# Patient Record
Sex: Female | Born: 1960 | Race: White | Hispanic: No | Marital: Married | Smoking: Never smoker
Health system: Southern US, Community
[De-identification: ages and names within clinical notes are randomized; demographics above are authoritative.]

## PROBLEM LIST (undated history)

## (undated) DIAGNOSIS — T4145XA Adverse effect of unspecified anesthetic, initial encounter: Secondary | ICD-10-CM

## (undated) DIAGNOSIS — M542 Cervicalgia: Secondary | ICD-10-CM

## (undated) DIAGNOSIS — F909 Attention-deficit hyperactivity disorder, unspecified type: Secondary | ICD-10-CM

## (undated) DIAGNOSIS — M1990 Unspecified osteoarthritis, unspecified site: Secondary | ICD-10-CM

## (undated) DIAGNOSIS — T8859XA Other complications of anesthesia, initial encounter: Secondary | ICD-10-CM

## (undated) DIAGNOSIS — G8929 Other chronic pain: Secondary | ICD-10-CM

## (undated) DIAGNOSIS — Z9889 Other specified postprocedural states: Secondary | ICD-10-CM

## (undated) DIAGNOSIS — K219 Gastro-esophageal reflux disease without esophagitis: Secondary | ICD-10-CM

## (undated) HISTORY — PX: TONSILLECTOMY: SUR1361

## (undated) HISTORY — PX: FEMUR FRACTURE SURGERY: SHX633

## (undated) HISTORY — PX: TUBAL LIGATION: SHX77

## (undated) HISTORY — PX: CERVICAL SPINE SURGERY: SHX589

---

## 1998-05-18 ENCOUNTER — Other Ambulatory Visit: Admission: RE | Admit: 1998-05-18 | Discharge: 1998-05-18 | Payer: Self-pay | Admitting: Obstetrics and Gynecology

## 1999-10-19 ENCOUNTER — Other Ambulatory Visit: Admission: RE | Admit: 1999-10-19 | Discharge: 1999-10-19 | Payer: Self-pay | Admitting: *Deleted

## 1999-10-20 ENCOUNTER — Encounter (INDEPENDENT_AMBULATORY_CARE_PROVIDER_SITE_OTHER): Payer: Self-pay

## 1999-10-20 ENCOUNTER — Other Ambulatory Visit: Admission: RE | Admit: 1999-10-20 | Discharge: 1999-10-20 | Payer: Self-pay | Admitting: *Deleted

## 2000-02-24 ENCOUNTER — Other Ambulatory Visit: Admission: RE | Admit: 2000-02-24 | Discharge: 2000-02-24 | Payer: Self-pay | Admitting: Obstetrics and Gynecology

## 2000-09-10 ENCOUNTER — Other Ambulatory Visit: Admission: RE | Admit: 2000-09-10 | Discharge: 2000-09-10 | Payer: Self-pay | Admitting: *Deleted

## 2001-11-12 ENCOUNTER — Other Ambulatory Visit: Admission: RE | Admit: 2001-11-12 | Discharge: 2001-11-12 | Payer: Self-pay | Admitting: Obstetrics and Gynecology

## 2002-07-31 ENCOUNTER — Encounter: Payer: Self-pay | Admitting: Neurological Surgery

## 2002-07-31 ENCOUNTER — Ambulatory Visit (HOSPITAL_COMMUNITY): Admission: RE | Admit: 2002-07-31 | Discharge: 2002-08-01 | Payer: Self-pay | Admitting: Neurological Surgery

## 2002-12-24 ENCOUNTER — Other Ambulatory Visit: Admission: RE | Admit: 2002-12-24 | Discharge: 2002-12-24 | Payer: Self-pay | Admitting: Obstetrics and Gynecology

## 2003-01-09 ENCOUNTER — Emergency Department (HOSPITAL_COMMUNITY): Admission: EM | Admit: 2003-01-09 | Discharge: 2003-01-09 | Payer: Self-pay | Admitting: Emergency Medicine

## 2003-01-09 ENCOUNTER — Encounter: Payer: Self-pay | Admitting: Emergency Medicine

## 2004-02-09 ENCOUNTER — Other Ambulatory Visit: Admission: RE | Admit: 2004-02-09 | Discharge: 2004-02-09 | Payer: Self-pay | Admitting: Obstetrics and Gynecology

## 2005-02-09 ENCOUNTER — Other Ambulatory Visit: Admission: RE | Admit: 2005-02-09 | Discharge: 2005-02-09 | Payer: Self-pay | Admitting: Obstetrics and Gynecology

## 2009-10-06 ENCOUNTER — Other Ambulatory Visit: Admission: RE | Admit: 2009-10-06 | Discharge: 2009-10-06 | Payer: Self-pay | Admitting: Family Medicine

## 2009-11-19 ENCOUNTER — Emergency Department (HOSPITAL_BASED_OUTPATIENT_CLINIC_OR_DEPARTMENT_OTHER): Admission: EM | Admit: 2009-11-19 | Discharge: 2009-11-19 | Payer: Self-pay | Admitting: Emergency Medicine

## 2009-11-19 ENCOUNTER — Ambulatory Visit: Payer: Self-pay | Admitting: Diagnostic Radiology

## 2009-12-30 ENCOUNTER — Other Ambulatory Visit: Admission: RE | Admit: 2009-12-30 | Discharge: 2009-12-30 | Payer: Self-pay | Admitting: Family Medicine

## 2009-12-30 ENCOUNTER — Emergency Department (HOSPITAL_COMMUNITY): Admission: EM | Admit: 2009-12-30 | Discharge: 2009-12-30 | Payer: Self-pay | Admitting: Emergency Medicine

## 2010-10-05 ENCOUNTER — Encounter
Admission: RE | Admit: 2010-10-05 | Discharge: 2010-10-05 | Payer: Self-pay | Source: Home / Self Care | Attending: Neurosurgery | Admitting: Neurosurgery

## 2010-10-27 ENCOUNTER — Ambulatory Visit (HOSPITAL_BASED_OUTPATIENT_CLINIC_OR_DEPARTMENT_OTHER)
Admission: RE | Admit: 2010-10-27 | Discharge: 2010-10-27 | Disposition: A | Discharge status: Home / Self Care | Payer: MEDICARE | Attending: Surgery | Admitting: Surgery

## 2010-10-27 ENCOUNTER — Other Ambulatory Visit: Payer: Self-pay | Admitting: Surgery

## 2010-10-27 DIAGNOSIS — D1739 Benign lipomatous neoplasm of skin and subcutaneous tissue of other sites: Secondary | ICD-10-CM | POA: Insufficient documentation

## 2010-10-27 LAB — POCT HEMOGLOBIN-HEMACUE: Hemoglobin: 14.6 g/dL (ref 12.0–15.0)

## 2010-11-09 NOTE — Op Note (Addendum)
  NAMEDALEY, MOORADIAN               ACCOUNT NO.:  [id number]  MEDICAL RECORD NO.:  [id number]          PATIENT TYPE:  AMB  LOCATION:  DSC                          FACILITY:  [facility]  PHYSICIAN:  Abigail Miyamoto, M.D. DATE OF BIRTH:  Oct 17, 1960  DATE OF PROCEDURE:  10/27/2010 DATE OF DISCHARGE:                              OPERATIVE REPORT   PREOPERATIVE DIAGNOSIS:  A 3-cm subcutaneous left back mass.  POSTOPERATIVE DIAGNOSIS:  A 3-cm subcutaneous left back mass.  PROCEDURE:  Simple excision of 3-cm subcutaneous left back mass.  SURGEON:  Abigail Miyamoto, MD  ANESTHESIA:  Lidocaine 1%, 0.5% Marcaine, and monitored anesthesia care.  ESTIMATED BLOOD LOSS:  Minimal.  FINDINGS:  The patient was noted to have a mass on her back which appeared consistent with lipoma.  It was sent to pathology for evaluation.  PROCEDURE IN DETAIL:  The patient was brought to the operating room and identified as Makayla Marshall.  She was placed supine on the operating room table and then turned to the right lateral decubitus position.  Her back was then prepped and draped in the usual sterile fashion.  The skin overlying the palpable mass was anesthetized with lidocaine.  I then made a longitudinal incision with a scalpel taking down to the tissue with electrocautery.  The mass appeared to consist lipoma.  I was able to elevate with an Allis clamp and excise it completely with the electrocautery.  It was then sent to pathology for evaluation.  I then anesthetized the wound further with Marcaine.  I then closed the subcutaneous tissue with interrupted 3-0 Vicryl sutures and closed the skin with a running 4-0 Monocryl.  Steri-Strips, gauze, and tape were applied.  The patient tolerated the procedure well.  All counts were correct at the end of the procedure.  The patient was then taken in stable condition from the operating room to the recovery room.     Abigail Miyamoto, M.D.     DB/MEDQ  D:   10/27/2010  T:  10/28/2010  Job:  [id number]  Electronically Signed by Abigail Miyamoto M.D. on 11/09/2010 07:30:27 PM

## 2011-01-20 ENCOUNTER — Other Ambulatory Visit: Payer: Self-pay | Admitting: Family Medicine

## 2011-01-20 ENCOUNTER — Other Ambulatory Visit (HOSPITAL_COMMUNITY)
Admission: RE | Admit: 2011-01-20 | Discharge: 2011-01-20 | Disposition: A | Discharge status: Home / Self Care | Payer: MEDICARE | Source: Ambulatory Visit | Attending: Family Medicine | Admitting: Family Medicine

## 2011-01-20 DIAGNOSIS — Z124 Encounter for screening for malignant neoplasm of cervix: Secondary | ICD-10-CM | POA: Insufficient documentation

## 2011-01-20 DIAGNOSIS — Z1159 Encounter for screening for other viral diseases: Secondary | ICD-10-CM | POA: Insufficient documentation

## 2011-01-20 DIAGNOSIS — Z113 Encounter for screening for infections with a predominantly sexual mode of transmission: Secondary | ICD-10-CM | POA: Insufficient documentation

## 2011-02-10 NOTE — Op Note (Signed)
Makayla Marshall, MATURA                            ACCOUNT NO.:  [id number]   MEDICAL RECORD NO.:  [id number]                   PATIENT TYPE:  OIB   LOCATION:  3172                                 FACILITY:  [facility]   PHYSICIAN:  Stefani Dama, M.D.               DATE OF BIRTH:  December 16, 1960   DATE OF PROCEDURE:  DATE OF DISCHARGE:                                 OPERATIVE REPORT   PREOPERATIVE DIAGNOSIS:  Postoperative wound hematoma.   POSTOPERATIVE DIAGNOSIS:  Postoperative wound hematoma.   OPERATION PERFORMED:  1. Re-exploration of anterior cervical decompression and fusion.  2. Evacuation of hematoma.   SURGEON:  Stefani Dama, M.D.   FIRST ASSISTANT:  Danae Orleans. Venetia Maxon, M.D.   ANESTHESIA:  General endotracheal.   INDICATIONS FOR SURGERY:  The patient is a 50 year old individual who had an  anterior cervical diskectomy just a couple hours ago.  She woke up in  excruciating pain in her neck, shoulder and right arm.  She also was noted  to have some weakness and it was felt that this may be related to bone graft  retropulsion, though none was really observed on the x-ray.  The CT scan was  then ordered and this demonstrated the bone grafts appeared to be in good  position.  It was noted also that she had developed some weakness into the  right lower extremity at that time.  Because of the progression of her  symptoms it was felt that surgical re-exploration of the wound was  imperative.   DESCRIPTION OF OPERATION:  The patient was brought to the operating room and  placed on the table in the supine position.  After significant induction of  general endotracheal anesthesia she was placed in the horseshoe headrest  without traction.  The incision, which had been dressed with DuraPrep, was  then cleansed with acetone being used to remove what portions of the  DuraPrep would come off.  DuraPrep was then used to redress the wound and  the wound was opened with Metzenbaum  scissors, opening the subcuticular  tissues.  The wound was then explored.  The hardware was identified.  There  was a minimal amount of blood in the soft tissue region.  The plates were  then removed, removing the locking screws first and then the six screws.   C6-7 was explored first as it was felt that this would be the likely source  of the hematoma.  With distraction being placed in the interspace bone graft  was removed.  Underneath this was a modest amount of clotted blood in the  epidural space.  I was not overwhelmed with the amount of blood that was  present and while I was removing the hematoma I asked Dr. Venetia Maxon to examine  the wound also.  Unfortunately the bulk of the blood had been removed by  this time.  No brisk bleeding  was encountered; however, there was some small  amount of bleeding from the epidural space in the lateral recess on that  right side.  This was controlled with bipolar cautery at first and some  small pledgets of Gelfoam were placed in the lateral recess on that right  side.  This was later irrigated away.  Because of our concerns that this  amount of blood was not the actual source of the problem C5-6 was similarly  explored and the bone graft was removed.  Here there was noted to be no  blood in the epidural space.  The dura was quite pristine in this region.   The area was explored and again some bleeding in the lateral recesses was  encountered, which had not been encountered previously.  So, it was  tamponade with some Gelfoam soaked thrombin, which was left in place.  In  the end the wound was irrigated with half-strength peroxide to help secure  hemostasis.  This did not seem to change the amount of oozing that occurred  from the wound.  Bone graft was replaced at C5-6 and then the bone graft was  ultimately replaced at C6-7 after irrigating away some of the Gelfoam and  placing some small pledget of Gelfoam over the lateral recess.  The bone   grafts were then resecured and the plate was resecured.  Care was taken to  make sure that there was adequate and good hemostasis in all the soft  tissues, and particularly from the areas of the disk space.  Once this was  doubly secured the wound was then closed again reapproximating the platysma  with 3-0 Vicryl in interrupted fashion and closing the skin with 4-0 Vicryl  in an interrupted fashion, and again placing Dermabond on the skin.   The patient tolerated the procedure well.                                                 Stefani Dama, M.D.    Merla Riches  D:  07/31/2002  T:  08/01/2002  Job:  [id number]

## 2011-02-10 NOTE — H&P (Signed)
NAMEJASHLEY, YELLIN                            ACCOUNT NO.:  [id number]   MEDICAL RECORD NO.:  [id number]                   PATIENT TYPE:  OIB   LOCATION:  3103                                 FACILITY:  [facility]   PHYSICIAN:  Stefani Dama, M.D.               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   HISTORY OF PRESENT ILLNESS:  The patient is a 50 year old individual who has  had significant neck, shoulder and right arm pain secondary to a spondylitic  region and herniated nucleus pulposus at C5-6 and severe spondylitic changes  at C6-7.  She had failed extensive efforts at conservative management and  after evaluation in the office a couple of weeks ago, she was advised  regarding anterior diskectomy and arthrodesis.  Because the pain has been so  severe and unrelenting, she is now admitted to undergo this two-level  procedure.   PAST MEDICAL HISTORY:  Past medical history reveals that her general health  has been good.  She reports no previous medical problems.   ALLERGIES:  She has no known allergies to any medications.   SURGICAL HISTORY:  Surgical history is nil.   SOCIAL HISTORY:  Social history reveals that she is single.  She works as a  Psychologist, occupational.   REVIEW OF SYSTEMS:  Systems review is notable only for the items in the  history of present illness, that is, neck, shoulder and right arm pain.   PHYSICAL EXAMINATION:  GENERAL:  Physical examination reveals that she is an  alert, oriented and cooperative individual in no overt distress.  NEUROMUSCULAR:  Range of motion of the neck reveals that she turns to the  right only 45 degrees and turns to the left 60 degrees.  She extends and  flexes 50% of normal and axial compression reproduces a Spurling's sign on  the right side.  She has marked trapezius spasm on that right side.  Motor  strength in the deltoids, biceps, triceps, grips and intrinsics reveals good  strength to confrontational testing.  Deep tendon reflexes are 2+ in the  biceps, 1+ in the triceps, 1+ in the brachioradialis, 2+ in the patellae and  the Achilles.  Babinski's are downgoing.  Sensation is intact to pain and  light touch in the distal upper extremities, save for the region of the  right thumb and index finger, which is diminished compared to the left.  NECK:  Exam reveals the neck has no masses and no bruits are heard.  LUNGS:  The lungs are clear to auscultation.  HEART:  The heart has a regular rate and rhythm.  ABDOMEN:  The abdomen is soft.  Bowel sounds are positive.  No masses are  palpable.  EXTREMITIES:  Extremities reveal no cyanosis, clubbing or edema.    IMPRESSION:  The patient has evidence of cervical spondylitic disease with  right cervical radiculopathy secondary to spondylitic changes at C5-6 and C6-  7.  She is now being admitted to undergo surgical decompression.                                               Stefani Dama, M.D.    Merla Riches  D:  09/11/2002  T:  09/12/2002  Job:  [id number]

## 2011-02-10 NOTE — Op Note (Signed)
Makayla Marshall, Makayla Marshall                            ACCOUNT NO.:  [id number]   MEDICAL RECORD NO.:  [id number]                   PATIENT TYPE:  OIB   LOCATION:  3172                                 FACILITY:  [facility]   PHYSICIAN:  Stefani Dama, M.D.               DATE OF BIRTH:  July 01, 1961   DATE OF PROCEDURE:  07/31/2002  DATE OF DISCHARGE:                                 OPERATIVE REPORT   PREOPERATIVE DIAGNOSIS:  Cervical spondylosis with right cervical  radiculopathy, C5-6 and C6-7.   POSTOPERATIVE DIAGNOSIS:  Cervical spondylosis with right cervical  radiculopathy, C5-6 and C6-7.   OPERATION PERFORMED:  Anterior cervical decompression and arthrodesis with  structural allograft, C5-6 and C6-7, Synthes plate fixation.   SURGEON:  Stefani Dama, M.D.   FIRST ASSISTANT:  Danae Orleans. Venetia Maxon, M.D.   ANESTHESIA:  General endotracheal.   INDICATIONS FOR PROCEDURE:  The patient is a 50 year old individual who has  had significant right shoulder and arm pain with a C6 radiculopathy on that  right side.  She was evaluated with an MRI and has undergone efforts at  conservative management but because of persistent pain and weakness in that  right arm, she has been advised regarding surgical decompression.   DESCRIPTION OF PROCEDURE:  The patient was brought to the operating room and  placed on the table in supine position.  After the smooth induction of  general endotracheal anesthesia, she was placed in five pounds of halter  traction.  The neck was shaved, prepped with DuraPrep and draped in a  sterile fashion.  A transverse incision was made in the mid portion of the  neck and this was carried down through the platysma.  The plane between the  sternocleidomastoid muscle and the strap muscles was dissected bluntly until  the prevertebral space was reached.  The first identifiable disk space was  noted to be that of C6-7 on the radiograph.  Dissection was then carried up  cephalad with  stripping of the longus colli muscle on either side to allow  placement of Caspar retractor.  Then a diskectomy was performed at C6-7  removing the ventral aspect of the disk and some large overgrown  osteophytes.  The disk space was entered and cleared.  Uncinate process  spurs were encountered laterally and these were decompressed using a high  speed air drill and a 2.3 mm dissecting tool.  Then the inferior margin of  the body of C6 with a ventral spur there was dissected and the spur was  removed.  The posterior longitudinal ligament was opened completely.  Dissection was carried out to the lateral recess first on the right side,  then on the left side.  The exit area of the C7 root was clear.  Some  epidural veins in this area which had bled were cauterized and some small  pledgets of Gelfoam soaked in thrombin  were laid over that.  In the end, the  thrombin soaked Gelfoam pledgets were removed.  The area was copiously  irrigated with antibiotic irrigating solution and then a 7 mm tricortical  graft was placed into the interspace with the cortical surface facing  dorsally.  At C5-6 similar ventral osteophytes were encountered and these  were removed.  The uncinate spur was particularly large on the right side in  the right lateral gutter and this was the area where the patient had her  worst symptoms.  This was drilled down and removed en bloc.  Then the  inferior margin of the body of C5 was similarly decompressed of ventral  osteophyte.  This extended out into the right and left side.  With this  being removed, the exit foramen were cleared.  Again, the posterior  longitudinal ligament was opened completely from side to side.  Hemostasis  was similarly achieved and again a tricortical graft was placed on the  cortical surface facing dorsally.  Traction was then removed and the patient  was fitted with a 37 mm standard size Synthes plate that was contoured to  the prevertebral spaces.   This was locked into position with six locking 4 x  14 mm screws.  During this procedure, Dr. Venetia Maxon helped with providing  traction of the interspace along with suction and irrigation while I  performed the decompression.  With fixation, he helped by stabilizing the  plate while the holes were drilled individually and then the screws were  set.  Once the screws were set and the retractors were removed, hemostasis  was checked carefully on the ventral aspects of the intervertebral spaces  and once this was achieved, successfully in the soft tissue, hemostasis was  also assured, the platysma was closed with 3-0 Vicryl in interrupted  fashion.  3-0 Vicryl was used to close the subcuticular tissues.  Dermabond  was placed on the skin.  The patient tolerated the procedure well and was  returned to the recovery room in stable condition.                                                Stefani Dama, M.D.    Merla Riches  D:  07/31/2002  T:  07/31/2002  Job:  [id number]

## 2011-11-13 ENCOUNTER — Other Ambulatory Visit (HOSPITAL_COMMUNITY)
Admission: RE | Admit: 2011-11-13 | Discharge: 2011-11-13 | Disposition: A | Discharge status: Home / Self Care | Payer: Medicare Other | Source: Ambulatory Visit | Attending: Family Medicine | Admitting: Family Medicine

## 2011-11-13 ENCOUNTER — Other Ambulatory Visit: Payer: Self-pay | Admitting: Family Medicine

## 2011-11-13 DIAGNOSIS — Z124 Encounter for screening for malignant neoplasm of cervix: Secondary | ICD-10-CM | POA: Insufficient documentation

## 2011-11-13 DIAGNOSIS — Z1159 Encounter for screening for other viral diseases: Secondary | ICD-10-CM | POA: Insufficient documentation

## 2012-07-22 ENCOUNTER — Other Ambulatory Visit: Payer: Self-pay | Admitting: Family Medicine

## 2012-07-22 ENCOUNTER — Other Ambulatory Visit (HOSPITAL_COMMUNITY)
Admission: RE | Admit: 2012-07-22 | Discharge: 2012-07-22 | Disposition: A | Discharge status: Home / Self Care | Payer: Medicare Other | Source: Ambulatory Visit | Attending: Family Medicine | Admitting: Family Medicine

## 2012-07-22 DIAGNOSIS — Z1151 Encounter for screening for human papillomavirus (HPV): Secondary | ICD-10-CM | POA: Insufficient documentation

## 2012-07-22 DIAGNOSIS — Z124 Encounter for screening for malignant neoplasm of cervix: Secondary | ICD-10-CM | POA: Insufficient documentation

## 2012-08-01 ENCOUNTER — Other Ambulatory Visit: Payer: Self-pay | Admitting: Family Medicine

## 2012-08-01 DIAGNOSIS — Z780 Asymptomatic menopausal state: Secondary | ICD-10-CM

## 2012-08-01 DIAGNOSIS — Z1231 Encounter for screening mammogram for malignant neoplasm of breast: Secondary | ICD-10-CM

## 2012-08-08 ENCOUNTER — Ambulatory Visit
Admission: RE | Admit: 2012-08-08 | Discharge: 2012-08-08 | Disposition: A | Discharge status: Home / Self Care | Payer: Medicare Other | Source: Ambulatory Visit | Attending: Family Medicine | Admitting: Family Medicine

## 2012-08-08 DIAGNOSIS — Z780 Asymptomatic menopausal state: Secondary | ICD-10-CM

## 2012-08-08 DIAGNOSIS — Z1231 Encounter for screening mammogram for malignant neoplasm of breast: Secondary | ICD-10-CM

## 2013-06-22 ENCOUNTER — Emergency Department (HOSPITAL_COMMUNITY)
Admission: EM | Admit: 2013-06-22 | Discharge: 2013-06-22 | Disposition: A | Discharge status: Home / Self Care | Payer: Medicare Other | Attending: Emergency Medicine | Admitting: Emergency Medicine

## 2013-06-22 ENCOUNTER — Encounter (HOSPITAL_COMMUNITY): Payer: Self-pay | Admitting: Nurse Practitioner

## 2013-06-22 ENCOUNTER — Emergency Department (HOSPITAL_COMMUNITY): Payer: Medicare Other

## 2013-06-22 DIAGNOSIS — S0993XA Unspecified injury of face, initial encounter: Secondary | ICD-10-CM | POA: Insufficient documentation

## 2013-06-22 DIAGNOSIS — Y9389 Activity, other specified: Secondary | ICD-10-CM | POA: Insufficient documentation

## 2013-06-22 DIAGNOSIS — G8929 Other chronic pain: Secondary | ICD-10-CM | POA: Insufficient documentation

## 2013-06-22 DIAGNOSIS — Y92410 Unspecified street and highway as the place of occurrence of the external cause: Secondary | ICD-10-CM | POA: Insufficient documentation

## 2013-06-22 DIAGNOSIS — Z8739 Personal history of other diseases of the musculoskeletal system and connective tissue: Secondary | ICD-10-CM | POA: Insufficient documentation

## 2013-06-22 HISTORY — DX: Cervicalgia: M54.2

## 2013-06-22 HISTORY — DX: Other chronic pain: G89.29

## 2013-06-22 HISTORY — DX: Unspecified osteoarthritis, unspecified site: M19.90

## 2013-06-22 MED ORDER — HYDROMORPHONE HCL PF 1 MG/ML IJ SOLN
0.5000 mg | Freq: Once | INTRAMUSCULAR | Status: AC
  Administered 2013-06-22: 0.5 mg via INTRAMUSCULAR
  Filled 2013-06-22: qty 1
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
## 2014-06-17 ENCOUNTER — Telehealth: Payer: Self-pay | Admitting: Obstetrics & Gynecology

## 2014-06-17 NOTE — Telephone Encounter (Signed)
[place] from Dr. Drema Dallas office called to speak with nurse about Dr. Ammie Ferrier recommendations for when patient is due for next pap smear. The patient has not been seen in our office since 03/09/11 for her new patient appointment. Please advise.

## 2014-06-17 NOTE — Telephone Encounter (Addendum)
Patient seen here 03-09-11 for NGYN/ colpo, CIN I. History of LEEP approximately 2010 by another MD. Last pap from PCP done 10-2011 was negative with negative HR HPV. Per Dr Ammie Ferrier note [phone], out of recall since pap normal 10-2011. See pre EPIC chart  Call to [place], She states patient had two normal paps in 2013 and Dr Drema Dallas is requesting Dr Ammie Ferrier recommendation on pap management from here. Advised I believe patient would now fall back to [facility] guidelines but will review with Dr Sabra Heck and call her back.  Pre EPIC chart to your office.

## 2014-06-17 NOTE — Telephone Encounter (Deleted)
Pre EPIC chart to your desk.

## 2014-06-19 NOTE — Telephone Encounter (Signed)
[place] out of office. Message from Dr Sabra Heck given to [place]. Does not need another pap HR HPV for five years. Call back with questions.  Encounter closed.

## 2014-06-19 NOTE — Telephone Encounter (Signed)
Reviewed paper chart and notes/paps in EPIC.  Has had two years of neg Paps with neg HR HPV.  Back to routine screening.  So, doesn't need another HR HPV test for five years.

## 2014-06-19 NOTE — Telephone Encounter (Signed)
Can I see paper chart please?

## 2014-06-19 NOTE — Telephone Encounter (Signed)
In your office, left of center on desk.

## 2014-08-18 ENCOUNTER — Ambulatory Visit: Payer: Self-pay | Admitting: Orthopedic Surgery

## 2014-08-18 NOTE — Progress Notes (Signed)
Preoperative surgical orders have been place into the Epic hospital system for Makayla Marshall on 08/18/2014, 1:57 PM  by Mickel Crow for surgery on 09-07-2014.  Preop Total Hip - Anterior Approach orders including Experel Injecion, IV Tylenol, and IV Decadron as long as there are no contraindications to the above medications. Arlee Muslim, PA-C

## 2014-08-26 NOTE — H&P (Signed)
TOTAL HIP ADMISSION H&P  Patient is admitted for left total hip arthroplasty.  Subjective:  Chief Complaint: left hip pain   HPI: Makayla Marshall, 53 y.o. female, has a history of pain and functional disability in the left hip(s) due to arthritis and patient has failed non-surgical conservative treatments for greater than 12 weeks to include NSAID's and/or analgesics, flexibility and strengthening excercises and activity modification.  Onset of symptoms was gradual starting 8 years ago with gradually worsening course since that time.The patient noted no past surgery on the left hip(s).  Patient currently rates pain in the left hip at 8 out of 10 with activity. Patient has night pain, worsening of pain with activity and weight bearing, pain that interfers with activities of daily living, pain with passive range of motion and crepitus. Patient has evidence of subchondral cysts, periarticular osteophytes and joint space narrowing by imaging studies. This condition presents safety issues increasing the risk of falls.  There is no current active infection.  Past Medical History  Diagnosis Date  . Arthritis   . Chronic neck pain     Past Surgical History  Procedure Laterality Date  . Cervical spine surgery, C6-C7 fusion    . Femur fracture surgery, right        No Known Allergies  History  Substance Use Topics  . Smoking status: Never Smoker   . Smokeless tobacco: Not on file  . Alcohol Use: 1.2 oz/week    2 Glasses of wine per week    Medications Adderall Topamax Advil Tramadol  Review of Systems  Constitutional: Positive for malaise/fatigue. Negative for fever, chills, weight loss and diaphoresis.  HENT: Negative.   Eyes: Negative.   Respiratory: Positive for shortness of breath. Negative for cough, hemoptysis, sputum production and wheezing.        SOB with exertion  Cardiovascular: Negative.   Gastrointestinal: Positive for heartburn and nausea. Negative for vomiting, abdominal  pain, diarrhea, constipation, blood in stool and melena.  Genitourinary: Negative.   Musculoskeletal: Positive for myalgias, back pain and joint pain. Negative for falls and neck pain.       Left hip pain  Skin: Negative.   Neurological: Negative.  Negative for weakness.  Endo/Heme/Allergies: Negative.   Psychiatric/Behavioral: Negative.     Objective:  Physical Exam  Constitutional: She is oriented to person, place, and time. She appears well-developed. No distress.  Overweight  HENT:  Head: Normocephalic and atraumatic.  Right Ear: External ear normal.  Left Ear: External ear normal.  Nose: Nose normal.  Mouth/Throat: Oropharynx is clear and moist.  Eyes: Conjunctivae and EOM are normal.  Neck: Normal range of motion. Neck supple.  Cardiovascular: Normal rate, regular rhythm, normal heart sounds and intact distal pulses.   No murmur heard. Respiratory: Effort normal and breath sounds normal. No respiratory distress. She has no wheezes.  GI: Soft. Bowel sounds are normal. She exhibits no distension. There is no tenderness.  Musculoskeletal:       Right hip: Normal.       Left hip: She exhibits decreased range of motion, decreased strength and crepitus.       Right knee: Normal.       Left knee: Normal.       Right lower leg: She exhibits no tenderness and no swelling.       Left lower leg: She exhibits no tenderness and no swelling.  Her right hip has normal range of motion with no discomfort. Her left hip has flexion to  95. Absolutely no internal or external rotation, only about 10 degrees of abduction. Her left knee exam is normal.  Neurological: She is alert and oriented to person, place, and time. She has normal strength and normal reflexes. No sensory deficit.  Skin: No rash noted. She is not diaphoretic. No erythema.  Psychiatric: She has a normal mood and affect. Her behavior is normal.    Vitals  Weight: 182 lb Height: 64in Body Surface Area: 1.88 m Body  Mass Index: 31.24 kg/m  Pulse: 84 (Regular)  BP: 132/78 (Sitting, Left Arm, Standard)  Imaging Review Plain radiographs demonstrate severe degenerative joint disease of the left hip(s). The bone quality appears to be good for age and reported activity level.  Assessment/Plan:  End stage arthritis, left hip(s)  The patient history, physical examination, clinical judgement of the provider and imaging studies are consistent with end stage degenerative joint disease of the left hip(s) and total hip arthroplasty is deemed medically necessary. The treatment options including medical management, injection therapy, arthroscopy and arthroplasty were discussed at length. The risks and benefits of total hip arthroplasty were presented and reviewed. The risks due to aseptic loosening, infection, stiffness, dislocation/subluxation,  thromboembolic complications and other imponderables were discussed.  The patient acknowledged the explanation, agreed to proceed with the plan and consent was signed. Patient is being admitted for inpatient treatment for surgery, pain control, PT, OT, prophylactic antibiotics, VTE prophylaxis, progressive ambulation and ADL's and discharge planning.The patient is planning to be discharged home with home health services    TXA IV  PCP: Dr. Edwin Dada   Ardeen Jourdain, PA-C

## 2014-08-31 ENCOUNTER — Ambulatory Visit (HOSPITAL_COMMUNITY)
Admission: RE | Admit: 2014-08-31 | Discharge: 2014-08-31 | Disposition: A | Discharge status: Home / Self Care | Payer: Medicare Other | Source: Ambulatory Visit | Attending: Orthopedic Surgery | Admitting: Orthopedic Surgery

## 2014-08-31 ENCOUNTER — Encounter (HOSPITAL_COMMUNITY): Payer: Self-pay

## 2014-08-31 ENCOUNTER — Encounter (HOSPITAL_COMMUNITY)
Admission: RE | Admit: 2014-08-31 | Discharge: 2014-08-31 | Disposition: A | Discharge status: Home / Self Care | Payer: Medicare Other | Source: Ambulatory Visit | Attending: Orthopedic Surgery | Admitting: Orthopedic Surgery

## 2014-08-31 DIAGNOSIS — M1612 Unilateral primary osteoarthritis, left hip: Secondary | ICD-10-CM | POA: Diagnosis present

## 2014-08-31 HISTORY — DX: Adverse effect of unspecified anesthetic, initial encounter: T41.45XA

## 2014-08-31 HISTORY — DX: Other complications of anesthesia, initial encounter: T88.59XA

## 2014-08-31 HISTORY — DX: Attention-deficit hyperactivity disorder, unspecified type: F90.9

## 2014-08-31 HISTORY — DX: Gastro-esophageal reflux disease without esophagitis: K21.9

## 2014-08-31 LAB — CBC
HEMATOCRIT: 41.9 % (ref 36.0–46.0)
HEMOGLOBIN: 14.2 g/dL (ref 12.0–15.0)
MCH: 30.8 pg (ref 26.0–34.0)
MCHC: 33.9 g/dL (ref 30.0–36.0)
MCV: 90.9 fL (ref 78.0–100.0)
Platelets: 317 10*3/uL (ref 150–400)
RBC: 4.61 MIL/uL (ref 3.87–5.11)
RDW: 11.9 % (ref 11.5–15.5)
WBC: 7 10*3/uL (ref 4.0–10.5)

## 2014-08-31 LAB — COMPREHENSIVE METABOLIC PANEL
ALK PHOS: 90 U/L (ref 39–117)
ALT: 20 U/L (ref 0–35)
ANION GAP: 11 (ref 5–15)
AST: 18 U/L (ref 0–37)
Albumin: 4.2 g/dL (ref 3.5–5.2)
BILIRUBIN TOTAL: 0.3 mg/dL (ref 0.3–1.2)
BUN: 12 mg/dL (ref 6–23)
CHLORIDE: 99 meq/L (ref 96–112)
CO2: 28 meq/L (ref 19–32)
CREATININE: 0.6 mg/dL (ref 0.50–1.10)
Calcium: 10.1 mg/dL (ref 8.4–10.5)
GLUCOSE: 99 mg/dL (ref 70–99)
POTASSIUM: 5.2 meq/L (ref 3.7–5.3)
Sodium: 138 mEq/L (ref 137–147)
Total Protein: 7.9 g/dL (ref 6.0–8.3)

## 2014-08-31 LAB — URINALYSIS, ROUTINE W REFLEX MICROSCOPIC
BILIRUBIN URINE: NEGATIVE
Glucose, UA: NEGATIVE mg/dL
HGB URINE DIPSTICK: NEGATIVE
Ketones, ur: NEGATIVE mg/dL
Nitrite: NEGATIVE
PH: 6.5 (ref 5.0–8.0)
Protein, ur: NEGATIVE mg/dL
SPECIFIC GRAVITY, URINE: 1.004 — AB (ref 1.005–1.030)
UROBILINOGEN UA: 0.2 mg/dL (ref 0.0–1.0)

## 2014-08-31 LAB — ABO/RH: ABO/RH(D): O POS

## 2014-08-31 LAB — APTT: aPTT: 29 seconds (ref 24–37)

## 2014-08-31 LAB — URINE MICROSCOPIC-ADD ON

## 2014-08-31 LAB — PROTIME-INR
INR: 0.99 (ref 0.00–1.49)
PROTHROMBIN TIME: 13.2 s (ref 11.6–15.2)

## 2014-08-31 LAB — SURGICAL PCR SCREEN
MRSA, PCR: NEGATIVE
Staphylococcus aureus: NEGATIVE

## 2014-08-31 NOTE — Patient Instructions (Addendum)
20     Your procedure is scheduled on:  Monday 09/07/2014  Report to [facility] Main Entrance and follow signs to Short Stay  at  1 PM.  Call this number if you have problems the morning of surgery: [phone]                    Remember:          Do not eat foods  AFTER MIDNIGHT! MAY HAVE CLEAR LIQUIDS FROM MIDNIGHT UP UNTIL 10 AM THEN NOTHING UNTIL AFTER SURGERY!  Take these medicines the morning of surgery with A SIP OF WATER: TRAMADOL (ULTRAM)    [facility] IS NOT RESPONSIBLE FOR ANY BELONGINGS OR VALUABLES BROUGHT TO HOSPITAL.  Marland Kitchen  Leave suitcase in the car. After surgery it may be brought to your room.     DO NOT WEAR JEWELRY,MAKE-UP,LOTIONS,POWDERS,PERFUMES,CONTACTS , DENTURES, BRIDGEWORK AND DO NOT WEAR FALSE EYELASHES  !                                  Patients discharged the day of surgery will not be allowed to drive home. If going home the same day of surgery, must have someone stay with you first 24 hrs.at home and arrange for someone to drive you home from the [place]: N/A   Special Instructions:              Please read over the following fact sheets that you were given:             1. [place] - Preparing for Surgery Before surgery, you can play an important role.  Because skin is not sterile, your skin needs to be as free of germs as possible.  You can reduce the number of germs on your skin by washing with CHG (chlorahexidine gluconate) soap before surgery.  CHG is an antiseptic cleaner which kills germs and bonds with the skin to continue killing germs even after washing. Please DO NOT use if you have an allergy to CHG or antibacterial soaps.  If your skin becomes reddened/irritated stop using the CHG and inform your nurse when you arrive at Short Stay. Do not shave (including  legs and underarms) for at least 48 hours prior to the first CHG shower.  You may shave your face/neck. Please follow these instructions carefully:  1.  Shower with CHG Soap the night before surgery and the  morning of Surgery.  2.  If you choose to wash your hair, wash your hair first as usual with your  normal  shampoo.  3.  After you shampoo, rinse your hair and body thoroughly to remove the  shampoo.  4.  Use CHG as you would any other liquid soap.  You can apply chg directly  to the skin and wash                       Gently with a scrungie or clean washcloth.  5.  Apply the CHG Soap to your body ONLY FROM THE NECK DOWN.   Do not use on face/ open                           Wound or open sores. Avoid contact with eyes, ears mouth and genitals (private parts).                       Wash face,  Genitals (private parts) with your normal soap.             6.  Wash thoroughly, paying special attention to the area where your surgery  will be performed.  7.  Thoroughly rinse your body with warm water from the neck down.  8.  DO NOT shower/wash with your normal soap after using and rinsing off  the CHG Soap.                9.  Pat yourself dry with a clean towel.            10.  Wear clean pajamas.            11.  Place clean sheets on your bed the night of your first shower and do not  sleep with pets. Day of Surgery : Do not apply any lotions/deodorants the morning of surgery.  Please wear clean clothes to the hospital/surgery center.  FAILURE TO FOLLOW THESE INSTRUCTIONS MAY RESULT IN THE CANCELLATION OF YOUR SURGERY PATIENT SIGNATURE_________________________________  NURSE SIGNATURE__________________________________  ________________________________________________________________________   Makayla Marshall  An incentive spirometer is a tool that can help keep your lungs clear and active. This tool measures how well you are filling your lungs with each breath.  Taking long deep breaths may help reverse or decrease the chance of developing breathing (pulmonary) problems (especially infection) following:  A long period of time when you are unable to move or be active. BEFORE THE PROCEDURE   If the spirometer includes an indicator to show your best effort, your nurse or respiratory therapist will set it to a desired goal.  If possible, sit up straight or lean slightly forward. Try not to slouch.  Hold the incentive spirometer in an upright position. INSTRUCTIONS FOR USE   Sit on the edge of your bed if possible, or sit up as far as you can in bed or on a chair.  Hold the incentive spirometer in an upright position.  Breathe out normally.  Place the mouthpiece in your mouth and seal your lips tightly around it.  Breathe in slowly and as deeply as possible, raising the piston or the ball toward the top of the column.  Hold your breath for 3-5 seconds or for as long as possible. Allow the piston or ball to fall to the bottom of the column.  Remove the mouthpiece from your mouth and breathe out normally.  Rest for a few seconds and repeat Steps 1 through 7 at least 10 times every 1-2 hours when you are awake. Take your time and take a few normal breaths between deep breaths.  The spirometer may include an indicator to show  your best effort. Use the indicator as a goal to work toward during each repetition.  After each set of 10 deep breaths, practice coughing to be sure your lungs are clear. If you have an incision (the cut made at the time of surgery), support your incision when coughing by placing a pillow or rolled up towels firmly against it. Once you are able to get out of bed, walk around indoors and cough well. You may stop using the incentive spirometer when instructed by your caregiver.  RISKS AND COMPLICATIONS  Take your time so you do not get dizzy or light-headed.  If you are in pain, you may need to take or ask for pain medication  before doing incentive spirometry. It is harder to take a deep breath if you are having pain. AFTER USE  Rest and breathe slowly and easily.  It can be helpful to keep track of a log of your progress. Your caregiver can provide you with a simple table to help with this. If you are using the spirometer at home, follow these instructions: [place] IF:   You are having difficultly using the spirometer.  You have trouble using the spirometer as often as instructed.  Your pain medication is not giving enough relief while using the spirometer.  You develop fever of 100.5 F (38.1 C) or higher. SEEK IMMEDIATE MEDICAL CARE IF:   You cough up bloody sputum that had not been present before.  You develop fever of 102 F (38.9 C) or greater.  You develop worsening pain at or near the incision site. MAKE SURE YOU:   Understand these instructions.  Will watch your condition.  Will get help right away if you are not doing well or get worse. Document Released: 01/22/2007 Document Revised: 12/04/2011 Document Reviewed: 03/25/2007 ExitCare Patient Information 2014 ExitCare, [place].   ________________________________________________________________________  WHAT IS A BLOOD TRANSFUSION? Blood Transfusion Information  A transfusion is the replacement of blood or some of its parts. Blood is made up of multiple cells which provide different functions.  Red blood cells carry oxygen and are used for blood loss replacement.  White blood cells fight against infection.  Platelets control bleeding.  Plasma helps clot blood.  Other blood products are available for specialized needs, such as hemophilia or other clotting disorders. BEFORE THE TRANSFUSION  Who gives blood for transfusions?   Healthy volunteers who are fully evaluated to make sure their blood is safe. This is blood bank blood. Transfusion therapy is the safest it has ever been in the practice of medicine. Before blood is  taken from a donor, a complete history is taken to make sure that person has no history of diseases nor engages in risky social behavior (examples are intravenous drug use or sexual activity with multiple partners). The donor's travel history is screened to minimize risk of transmitting infections, such as malaria. The donated blood is tested for signs of infectious diseases, such as HIV and hepatitis. The blood is then tested to be sure it is compatible with you in order to minimize the chance of a transfusion reaction. If you or a relative donates blood, this is often done in anticipation of surgery and is not appropriate for emergency situations. It takes many days to process the donated blood. RISKS AND COMPLICATIONS Although transfusion therapy is very safe and saves many lives, the main dangers of transfusion include:   Getting an infectious disease.  Developing a transfusion reaction. This is an allergic reaction to  something in the blood you were given. Every precaution is taken to prevent this. The decision to have a blood transfusion has been considered carefully by your caregiver before blood is given. Blood is not given unless the benefits outweigh the risks. AFTER THE TRANSFUSION  Right after receiving a blood transfusion, you will usually feel much better and more energetic. This is especially true if your red blood cells have gotten low (anemic). The transfusion raises the level of the red blood cells which carry oxygen, and this usually causes an energy increase.  The nurse administering the transfusion will monitor you carefully for complications. HOME CARE INSTRUCTIONS  No special instructions are needed after a transfusion. You may find your energy is better. Speak with your caregiver about any limitations on activity for underlying diseases you may have. SEEK MEDICAL CARE IF:   Your condition is not improving after your transfusion.  You develop redness or irritation at the  intravenous (IV) site. SEEK IMMEDIATE MEDICAL CARE IF:  Any of the following symptoms occur over the next 12 hours:  Shaking chills.  You have a temperature by mouth above 102 F (38.9 C), not controlled by medicine.  Chest, back, or muscle pain.  People around you feel you are not acting correctly or are confused.  Shortness of breath or difficulty breathing.  Dizziness and fainting.  You get a rash or develop hives.  You have a decrease in urine output.  Your urine turns a dark color or changes to pink, red, or brown. Any of the following symptoms occur over the next 10 days:  You have a temperature by mouth above 102 F (38.9 C), not controlled by medicine.  Shortness of breath.  Weakness after normal activity.  The white part of the eye turns yellow (jaundice).  You have a decrease in the amount of urine or are urinating less often.  Your urine turns a dark color or changes to pink, red, or brown. Document Released: 09/08/2000 Document Revised: 12/04/2011 Document Reviewed: 04/27/2008 ExitCare Patient Information 2014 ExitCare, [place].  _______________________________________________________________________   CLEAR LIQUID DIET   Foods Allowed                                                                     Foods Excluded  Coffee and tea, regular and decaf                             liquids that you cannot  Plain Jell-O in any flavor                                             see through such as: Fruit ices (not with fruit pulp)                                     milk, soups, orange juice  Iced Popsicles  All solid food Carbonated beverages, regular and diet                                    Cranberry, grape and apple juices Sports drinks like Gatorade Lightly seasoned clear broth or consume(fat free) Sugar, honey syrup  Sample Menu Breakfast                                Lunch                                      Supper Cranberry juice                    Beef broth                            Chicken broth Jell-O                                     Grape juice                           Apple juice Coffee or tea                        Jell-O                                      Popsicle                                                Coffee or tea                        Coffee or tea  _____________________________________________________________________

## 2014-08-31 NOTE — Progress Notes (Signed)
06/16/2014-Pre-operative clearance form from Dr. Earle Gell on chart.

## 2014-09-03 NOTE — Progress Notes (Signed)
Called patient and notified them of time change for surgery on 09/07/14 ,to report to Short Stay at [facility] at 1135 am and clear liquids from midnight up until 0835 then nothing until after surgery. Patient verbalized understanding.

## 2014-09-07 ENCOUNTER — Inpatient Hospital Stay (HOSPITAL_COMMUNITY): Payer: Medicare Other

## 2014-09-07 ENCOUNTER — Inpatient Hospital Stay (HOSPITAL_COMMUNITY)
Admission: RE | Admit: 2014-09-07 | Discharge: 2014-09-09 | DRG: 470 | Disposition: A | Discharge status: Home / Self Care | Payer: Medicare Other | Source: Ambulatory Visit | Attending: Orthopedic Surgery | Admitting: Orthopedic Surgery

## 2014-09-07 ENCOUNTER — Encounter (HOSPITAL_COMMUNITY)
Admission: RE | Disposition: A | Discharge status: Home / Self Care | Payer: Self-pay | Source: Ambulatory Visit | Attending: Orthopedic Surgery

## 2014-09-07 ENCOUNTER — Encounter (HOSPITAL_COMMUNITY): Payer: Self-pay | Admitting: *Deleted

## 2014-09-07 ENCOUNTER — Inpatient Hospital Stay (HOSPITAL_COMMUNITY): Payer: Medicare Other | Admitting: *Deleted

## 2014-09-07 DIAGNOSIS — M1612 Unilateral primary osteoarthritis, left hip: Secondary | ICD-10-CM | POA: Diagnosis not present

## 2014-09-07 DIAGNOSIS — F909 Attention-deficit hyperactivity disorder, unspecified type: Secondary | ICD-10-CM | POA: Diagnosis present

## 2014-09-07 DIAGNOSIS — Z6830 Body mass index (BMI) 30.0-30.9, adult: Secondary | ICD-10-CM | POA: Diagnosis not present

## 2014-09-07 DIAGNOSIS — Z96649 Presence of unspecified artificial hip joint: Secondary | ICD-10-CM

## 2014-09-07 DIAGNOSIS — M169 Osteoarthritis of hip, unspecified: Secondary | ICD-10-CM | POA: Diagnosis present

## 2014-09-07 DIAGNOSIS — E663 Overweight: Secondary | ICD-10-CM | POA: Diagnosis not present

## 2014-09-07 DIAGNOSIS — K219 Gastro-esophageal reflux disease without esophagitis: Secondary | ICD-10-CM | POA: Diagnosis present

## 2014-09-07 DIAGNOSIS — M25552 Pain in left hip: Secondary | ICD-10-CM | POA: Diagnosis present

## 2014-09-07 HISTORY — PX: TOTAL HIP ARTHROPLASTY: SHX124

## 2014-09-07 LAB — TYPE AND SCREEN
ABO/RH(D): O POS
Antibody Screen: NEGATIVE

## 2014-09-07 SURGERY — ARTHROPLASTY, HIP, TOTAL, ANTERIOR APPROACH
Anesthesia: Spinal | Site: Hip | Laterality: Left

## 2014-09-07 MED ORDER — SODIUM CHLORIDE 0.9 % IV SOLN: INTRAVENOUS | Status: DC

## 2014-09-07 MED ORDER — AMPHETAMINE-DEXTROAMPHETAMINE 10 MG PO TABS
20.0000 mg | ORAL_TABLET | Freq: Every morning | ORAL | Status: DC
  Administered 2014-09-08 – 2014-09-09 (×2): 20 mg via ORAL
  Filled 2014-09-07 (×2): qty 2

## 2014-09-07 MED ORDER — METHOCARBAMOL 500 MG PO TABS
500.0000 mg | ORAL_TABLET | Freq: Four times a day (QID) | ORAL | Status: DC | PRN
  Administered 2014-09-07 – 2014-09-09 (×5): 500 mg via ORAL
  Filled 2014-09-07 (×6): qty 1

## 2014-09-07 MED ORDER — ACETAMINOPHEN 650 MG RE SUPP: 650.0000 mg | Freq: Four times a day (QID) | RECTAL | Status: DC | PRN

## 2014-09-07 MED ORDER — ACETAMINOPHEN 10 MG/ML IV SOLN
1000.0000 mg | Freq: Once | INTRAVENOUS | Status: AC
  Administered 2014-09-07: 1000 mg via INTRAVENOUS
  Filled 2014-09-07: qty 100

## 2014-09-07 MED ORDER — BUPIVACAINE LIPOSOME 1.3 % IJ SUSP
INTRAMUSCULAR | Status: DC | PRN
  Administered 2014-09-07: 20 mL

## 2014-09-07 MED ORDER — MIDAZOLAM HCL 2 MG/2ML IJ SOLN
INTRAMUSCULAR | Status: AC
  Filled 2014-09-07: qty 2

## 2014-09-07 MED ORDER — PHENYLEPHRINE 40 MCG/ML (10ML) SYRINGE FOR IV PUSH (FOR BLOOD PRESSURE SUPPORT)
PREFILLED_SYRINGE | INTRAVENOUS | Status: AC
  Filled 2014-09-07: qty 10

## 2014-09-07 MED ORDER — SCOPOLAMINE 1 MG/3DAYS TD PT72
MEDICATED_PATCH | TRANSDERMAL | Status: DC | PRN
  Administered 2014-09-07: 1 via TRANSDERMAL

## 2014-09-07 MED ORDER — FLEET ENEMA 7-19 GM/118ML RE ENEM: 1.0000 | ENEMA | Freq: Once | RECTAL | Status: AC | PRN

## 2014-09-07 MED ORDER — ONDANSETRON HCL 4 MG/2ML IJ SOLN: 4.0000 mg | Freq: Four times a day (QID) | INTRAMUSCULAR | Status: DC | PRN

## 2014-09-07 MED ORDER — FENTANYL CITRATE 0.05 MG/ML IJ SOLN
INTRAMUSCULAR | Status: AC
  Filled 2014-09-07: qty 2

## 2014-09-07 MED ORDER — SODIUM CHLORIDE 0.9 % IJ SOLN
INTRAMUSCULAR | Status: AC
  Filled 2014-09-07: qty 50

## 2014-09-07 MED ORDER — METOCLOPRAMIDE HCL 10 MG PO TABS
5.0000 mg | ORAL_TABLET | Freq: Three times a day (TID) | ORAL | Status: DC | PRN
Start: 2014-09-07 — End: 2014-09-09

## 2014-09-07 MED ORDER — PHENYLEPHRINE 40 MCG/ML (10ML) SYRINGE FOR IV PUSH (FOR BLOOD PRESSURE SUPPORT)
PREFILLED_SYRINGE | INTRAVENOUS | Status: AC
  Filled 2014-09-07: qty 20

## 2014-09-07 MED ORDER — LACTATED RINGERS IV SOLN: INTRAVENOUS | Status: DC

## 2014-09-07 MED ORDER — DEXTROSE-NACL 5-0.9 % IV SOLN
INTRAVENOUS | Status: DC
  Administered 2014-09-07: 17:00:00 via INTRAVENOUS

## 2014-09-07 MED ORDER — DEXAMETHASONE SODIUM PHOSPHATE 10 MG/ML IJ SOLN
INTRAMUSCULAR | Status: DC | PRN
  Administered 2014-09-07: 10 mg via INTRAVENOUS

## 2014-09-07 MED ORDER — CEFAZOLIN SODIUM-DEXTROSE 2-3 GM-% IV SOLR
2.0000 g | INTRAVENOUS | Status: AC
  Administered 2014-09-07: 2 g via INTRAVENOUS

## 2014-09-07 MED ORDER — ONDANSETRON HCL 4 MG PO TABS: 4.0000 mg | ORAL_TABLET | Freq: Four times a day (QID) | ORAL | Status: DC | PRN

## 2014-09-07 MED ORDER — ACETAMINOPHEN 325 MG PO TABS: 650.0000 mg | ORAL_TABLET | Freq: Four times a day (QID) | ORAL | Status: DC | PRN

## 2014-09-07 MED ORDER — OXYCODONE HCL 5 MG PO TABS
5.0000 mg | ORAL_TABLET | ORAL | Status: DC | PRN
  Administered 2014-09-07: 10 mg via ORAL
  Administered 2014-09-07: 5 mg via ORAL
  Administered 2014-09-08 (×3): 10 mg via ORAL
  Filled 2014-09-07 (×3): qty 2
  Filled 2014-09-07: qty 1
  Filled 2014-09-07 (×2): qty 2

## 2014-09-07 MED ORDER — ONDANSETRON HCL 4 MG/2ML IJ SOLN
INTRAMUSCULAR | Status: DC | PRN
  Administered 2014-09-07: 4 mg via INTRAVENOUS

## 2014-09-07 MED ORDER — METHOCARBAMOL 1000 MG/10ML IJ SOLN
500.0000 mg | Freq: Four times a day (QID) | INTRAVENOUS | Status: DC | PRN
  Administered 2014-09-07: 500 mg via INTRAVENOUS
  Filled 2014-09-07 (×2): qty 5

## 2014-09-07 MED ORDER — GLYCOPYRROLATE 0.2 MG/ML IJ SOLN
INTRAMUSCULAR | Status: DC | PRN
  Administered 2014-09-07 (×2): 0.1 mg via INTRAVENOUS

## 2014-09-07 MED ORDER — BUPIVACAINE LIPOSOME 1.3 % IJ SUSP
20.0000 mL | Freq: Once | INTRAMUSCULAR | Status: DC
  Filled 2014-09-07: qty 20

## 2014-09-07 MED ORDER — PROPOFOL 10 MG/ML IV BOLUS
INTRAVENOUS | Status: AC
  Filled 2014-09-07: qty 20

## 2014-09-07 MED ORDER — MIDAZOLAM HCL 5 MG/5ML IJ SOLN
INTRAMUSCULAR | Status: DC | PRN
  Administered 2014-09-07: 0.5 mg via INTRAVENOUS
  Administered 2014-09-07: 1 mg via INTRAVENOUS
  Administered 2014-09-07: 0.5 mg via INTRAVENOUS
  Administered 2014-09-07: 2 mg via INTRAVENOUS

## 2014-09-07 MED ORDER — METOCLOPRAMIDE HCL 5 MG/ML IJ SOLN
INTRAMUSCULAR | Status: DC | PRN
  Administered 2014-09-07: 10 mg via INTRAVENOUS

## 2014-09-07 MED ORDER — HYDROMORPHONE HCL 1 MG/ML IJ SOLN
INTRAMUSCULAR | Status: AC
  Filled 2014-09-07: qty 1

## 2014-09-07 MED ORDER — MENTHOL 3 MG MT LOZG: 1.0000 | LOZENGE | OROMUCOSAL | Status: DC | PRN

## 2014-09-07 MED ORDER — BUPIVACAINE HCL (PF) 0.5 % IJ SOLN
INTRAMUSCULAR | Status: DC | PRN
  Administered 2014-09-07: 3 mL

## 2014-09-07 MED ORDER — ZOLPIDEM TARTRATE 5 MG PO TABS
5.0000 mg | ORAL_TABLET | Freq: Once | ORAL | Status: AC
  Administered 2014-09-07: 5 mg via ORAL
  Filled 2014-09-07: qty 1

## 2014-09-07 MED ORDER — TRAMADOL HCL 50 MG PO TABS
50.0000 mg | ORAL_TABLET | Freq: Four times a day (QID) | ORAL | Status: DC | PRN
  Administered 2014-09-08: 100 mg via ORAL
  Filled 2014-09-07: qty 2

## 2014-09-07 MED ORDER — PROMETHAZINE HCL 25 MG/ML IJ SOLN: 6.2500 mg | INTRAMUSCULAR | Status: DC | PRN

## 2014-09-07 MED ORDER — KETOROLAC TROMETHAMINE 15 MG/ML IJ SOLN
7.5000 mg | Freq: Four times a day (QID) | INTRAMUSCULAR | Status: AC | PRN
  Administered 2014-09-07: 7.5 mg via INTRAVENOUS
  Filled 2014-09-07: qty 1

## 2014-09-07 MED ORDER — PROPOFOL INFUSION 10 MG/ML OPTIME
INTRAVENOUS | Status: DC | PRN
  Administered 2014-09-07: 140 ug/kg/min via INTRAVENOUS

## 2014-09-07 MED ORDER — RIVAROXABAN 10 MG PO TABS
10.0000 mg | ORAL_TABLET | Freq: Every day | ORAL | Status: DC
  Administered 2014-09-08 – 2014-09-09 (×2): 10 mg via ORAL
  Filled 2014-09-07 (×3): qty 1

## 2014-09-07 MED ORDER — PHENYLEPHRINE HCL 10 MG/ML IJ SOLN
INTRAMUSCULAR | Status: DC | PRN
  Administered 2014-09-07 (×2): 80 ug via INTRAVENOUS
  Administered 2014-09-07: 40 ug via INTRAVENOUS
  Administered 2014-09-07 (×2): 80 ug via INTRAVENOUS
  Administered 2014-09-07: 40 ug via INTRAVENOUS
  Administered 2014-09-07 (×2): 80 ug via INTRAVENOUS

## 2014-09-07 MED ORDER — DEXAMETHASONE SODIUM PHOSPHATE 10 MG/ML IJ SOLN
10.0000 mg | Freq: Once | INTRAMUSCULAR | Status: AC
  Administered 2014-09-08: 10 mg via INTRAVENOUS
  Filled 2014-09-07: qty 1

## 2014-09-07 MED ORDER — SODIUM CHLORIDE 0.9 % IV SOLN
1000.0000 mg | INTRAVENOUS | Status: AC
  Administered 2014-09-07: 1000 mg via INTRAVENOUS
  Filled 2014-09-07: qty 10

## 2014-09-07 MED ORDER — PSEUDOEPHEDRINE-GUAIFENESIN ER 60-600 MG PO TB12: 1.0000 | ORAL_TABLET | Freq: Two times a day (BID) | ORAL | Status: DC | PRN

## 2014-09-07 MED ORDER — FENTANYL CITRATE 0.05 MG/ML IJ SOLN
INTRAMUSCULAR | Status: DC | PRN
  Administered 2014-09-07: 12.5 ug via INTRAVENOUS

## 2014-09-07 MED ORDER — DIPHENHYDRAMINE HCL 12.5 MG/5ML PO ELIX
12.5000 mg | ORAL_SOLUTION | ORAL | Status: DC | PRN
  Administered 2014-09-08: 25 mg via ORAL
  Filled 2014-09-07: qty 10

## 2014-09-07 MED ORDER — BUPIVACAINE HCL (PF) 0.25 % IJ SOLN
INTRAMUSCULAR | Status: DC | PRN
  Administered 2014-09-07: 20 mL

## 2014-09-07 MED ORDER — ACETAMINOPHEN 500 MG PO TABS
1000.0000 mg | ORAL_TABLET | Freq: Four times a day (QID) | ORAL | Status: AC
Start: 2014-09-07 — End: 2014-09-08
  Administered 2014-09-07 – 2014-09-08 (×4): 1000 mg via ORAL
  Filled 2014-09-07 (×5): qty 2

## 2014-09-07 MED ORDER — EPHEDRINE SULFATE 50 MG/ML IJ SOLN
INTRAMUSCULAR | Status: AC
  Filled 2014-09-07: qty 1

## 2014-09-07 MED ORDER — CEFAZOLIN SODIUM-DEXTROSE 2-3 GM-% IV SOLR
INTRAVENOUS | Status: AC
  Filled 2014-09-07: qty 50

## 2014-09-07 MED ORDER — LACTATED RINGERS IV SOLN
INTRAVENOUS | Status: DC | PRN
  Administered 2014-09-07 (×3): via INTRAVENOUS

## 2014-09-07 MED ORDER — MEPERIDINE HCL 50 MG/ML IJ SOLN
6.2500 mg | INTRAMUSCULAR | Status: DC | PRN
  Administered 2014-09-07: 12.5 mg via INTRAVENOUS

## 2014-09-07 MED ORDER — METOCLOPRAMIDE HCL 5 MG/ML IJ SOLN: 5.0000 mg | Freq: Three times a day (TID) | INTRAMUSCULAR | Status: DC | PRN

## 2014-09-07 MED ORDER — SODIUM CHLORIDE 0.9 % IJ SOLN
INTRAMUSCULAR | Status: DC | PRN
  Administered 2014-09-07: 30 mL

## 2014-09-07 MED ORDER — MORPHINE SULFATE 2 MG/ML IJ SOLN
1.0000 mg | INTRAMUSCULAR | Status: DC | PRN
  Administered 2014-09-07 (×2): 1 mg via INTRAVENOUS
  Administered 2014-09-08 (×2): 2 mg via INTRAVENOUS
  Filled 2014-09-07 (×4): qty 1

## 2014-09-07 MED ORDER — POLYETHYLENE GLYCOL 3350 17 G PO PACK: 17.0000 g | PACK | Freq: Every day | ORAL | Status: DC | PRN

## 2014-09-07 MED ORDER — EPHEDRINE SULFATE 50 MG/ML IJ SOLN
INTRAMUSCULAR | Status: DC | PRN
  Administered 2014-09-07 (×2): 10 mg via INTRAVENOUS
  Administered 2014-09-07: 15 mg via INTRAVENOUS
  Administered 2014-09-07 (×2): 10 mg via INTRAVENOUS

## 2014-09-07 MED ORDER — MEPERIDINE HCL 50 MG/ML IJ SOLN
INTRAMUSCULAR | Status: AC
  Filled 2014-09-07: qty 1

## 2014-09-07 MED ORDER — CEFAZOLIN SODIUM-DEXTROSE 2-3 GM-% IV SOLR
2.0000 g | Freq: Four times a day (QID) | INTRAVENOUS | Status: AC
  Administered 2014-09-07 – 2014-09-08 (×2): 2 g via INTRAVENOUS
  Filled 2014-09-07 (×2): qty 50

## 2014-09-07 MED ORDER — PROPOFOL 10 MG/ML IV BOLUS
INTRAVENOUS | Status: DC | PRN
  Administered 2014-09-07 (×2): 50 mg via INTRAVENOUS
  Administered 2014-09-07: 30 mg via INTRAVENOUS

## 2014-09-07 MED ORDER — PHENOL 1.4 % MT LIQD: 1.0000 | OROMUCOSAL | Status: DC | PRN

## 2014-09-07 MED ORDER — BUPIVACAINE HCL (PF) 0.25 % IJ SOLN
INTRAMUSCULAR | Status: AC
  Filled 2014-09-07: qty 30

## 2014-09-07 MED ORDER — DOCUSATE SODIUM 100 MG PO CAPS
100.0000 mg | ORAL_CAPSULE | Freq: Two times a day (BID) | ORAL | Status: DC
  Administered 2014-09-07 – 2014-09-09 (×4): 100 mg via ORAL

## 2014-09-07 MED ORDER — DEXAMETHASONE SODIUM PHOSPHATE 10 MG/ML IJ SOLN: 10.0000 mg | Freq: Once | INTRAMUSCULAR | Status: DC

## 2014-09-07 MED ORDER — HYDROMORPHONE HCL 1 MG/ML IJ SOLN
0.2500 mg | INTRAMUSCULAR | Status: DC | PRN
Start: 2014-09-07 — End: 2014-09-07
  Administered 2014-09-07: 0.5 mg via INTRAVENOUS

## 2014-09-07 MED ORDER — BISACODYL 10 MG RE SUPP: 10.0000 mg | Freq: Every day | RECTAL | Status: DC | PRN

## 2014-09-07 MED ORDER — SCOPOLAMINE 1 MG/3DAYS TD PT72
MEDICATED_PATCH | TRANSDERMAL | Status: AC
  Filled 2014-09-07: qty 1

## 2014-09-07 MED ORDER — DM-GUAIFENESIN ER 30-600 MG PO TB12
1.0000 | ORAL_TABLET | Freq: Two times a day (BID) | ORAL | Status: DC | PRN
  Filled 2014-09-07: qty 1

## 2014-09-07 SURGICAL SUPPLY — 38 items
BAG ZIPLOCK 12X15 (MISCELLANEOUS) IMPLANT
BLADE EXTENDED COATED 6.5IN (ELECTRODE) ×3 IMPLANT
BLADE SAG 18X100X1.27 (BLADE) ×3 IMPLANT
CAPT HIP TOTAL 3 ×3 IMPLANT
CLOSURE WOUND 1/2 X4 (GAUZE/BANDAGES/DRESSINGS) ×1
COVER PERINEAL POST (MISCELLANEOUS) ×3 IMPLANT
DECANTER SPIKE VIAL GLASS SM (MISCELLANEOUS) ×3 IMPLANT
DRAPE C-ARM 42X120 X-RAY (DRAPES) ×3 IMPLANT
DRAPE STERI IOBAN 125X83 (DRAPES) ×3 IMPLANT
DRAPE U-SHAPE 47X51 STRL (DRAPES) ×9 IMPLANT
DRSG ADAPTIC 3X8 NADH LF (GAUZE/BANDAGES/DRESSINGS) IMPLANT
DRSG MEPILEX BORDER 4X4 (GAUZE/BANDAGES/DRESSINGS) ×3 IMPLANT
DRSG MEPILEX BORDER 4X8 (GAUZE/BANDAGES/DRESSINGS) ×3 IMPLANT
DURAPREP 26ML APPLICATOR (WOUND CARE) ×3 IMPLANT
ELECT REM PT RETURN 9FT ADLT (ELECTROSURGICAL) ×3
ELECTRODE REM PT RTRN 9FT ADLT (ELECTROSURGICAL) ×1 IMPLANT
EVACUATOR 1/8 PVC DRAIN (DRAIN) ×3 IMPLANT
FACESHIELD WRAPAROUND (MASK) ×12 IMPLANT
GLOVE BIO SURGEON STRL SZ7.5 (GLOVE) ×3 IMPLANT
GLOVE BIO SURGEON STRL SZ8 (GLOVE) ×6 IMPLANT
GLOVE BIOGEL PI IND STRL 8 (GLOVE) ×2 IMPLANT
GLOVE BIOGEL PI INDICATOR 8 (GLOVE) ×4
GOWN STRL REUS W/TWL LRG LVL3 (GOWN DISPOSABLE) ×3 IMPLANT
GOWN STRL REUS W/TWL XL LVL3 (GOWN DISPOSABLE) ×3 IMPLANT
KIT BASIN OR (CUSTOM PROCEDURE TRAY) ×3 IMPLANT
NDL SAFETY ECLIPSE 18X1.5 (NEEDLE) ×2 IMPLANT
NEEDLE HYPO 18GX1.5 SHARP (NEEDLE) ×4
PACK TOTAL JOINT (CUSTOM PROCEDURE TRAY) ×3 IMPLANT
STRIP CLOSURE SKIN 1/2X4 (GAUZE/BANDAGES/DRESSINGS) ×2 IMPLANT
SUT ETHIBOND NAB CT1 #1 30IN (SUTURE) ×3 IMPLANT
SUT MNCRL AB 4-0 PS2 18 (SUTURE) ×3 IMPLANT
SUT VIC AB 2-0 CT1 27 (SUTURE) ×4
SUT VIC AB 2-0 CT1 TAPERPNT 27 (SUTURE) ×2 IMPLANT
SUT VLOC 180 0 24IN GS25 (SUTURE) ×3 IMPLANT
SYR 20CC LL (SYRINGE) ×3 IMPLANT
SYR 50ML LL SCALE MARK (SYRINGE) ×3 IMPLANT
TOWEL OR 17X26 10 PK STRL BLUE (TOWEL DISPOSABLE) ×3 IMPLANT
TRAY FOLEY CATH 14FRSI W/METER (CATHETERS) ×3 IMPLANT

## 2014-09-07 NOTE — Progress Notes (Signed)
Pt shivering, warm blankets on, temp 97.5 oral, med given

## 2014-09-07 NOTE — Transfer of Care (Signed)
Immediate Anesthesia Transfer of Care Note  Patient: Makayla Marshall  Procedure(s) Performed: Procedure(s): LEFT TOTAL HIP ARTHROPLASTY ANTERIOR APPROACH (Left)  Patient Location: PACU  Anesthesia Type:MAC and Spinal  Level of Consciousness: Patient easily awoken, sedated, comfortable, cooperative, following commands, responds to stimulation.   Airway & Oxygen Therapy: Patient spontaneously breathing, ventilating well, oxygen via simple oxygen mask.  Post-op Assessment: Report given to PACU RN, vital signs reviewed and stable.   Post vital signs: Reviewed and stable.  Complications: No apparent anesthesia complications

## 2014-09-07 NOTE — Anesthesia Postprocedure Evaluation (Signed)
  Anesthesia Post-op Note  Patient: Makayla Marshall  Procedure(s) Performed: Procedure(s) (LRB): LEFT TOTAL HIP ARTHROPLASTY ANTERIOR APPROACH (Left)  Patient Location: PACU  Anesthesia Type: Spinal  Level of Consciousness: awake and alert   Airway and Oxygen Therapy: Patient Spontanous Breathing  Post-op Pain: mild  Post-op Assessment: Post-op Vital signs reviewed, Patient's Cardiovascular Status Stable, Respiratory Function Stable, Patent Airway and No signs of Nausea or vomiting  Last Vitals:  Filed Vitals:   09/07/14 1859  BP: 115/65  Pulse: 68  Temp: 36.3 C  Resp: 16    Post-op Vital Signs: stable   Complications: No apparent anesthesia complications

## 2014-09-07 NOTE — Op Note (Signed)
OPERATIVE REPORT  PREOPERATIVE DIAGNOSIS: Osteoarthritis of the Left hip.   POSTOPERATIVE DIAGNOSIS: Osteoarthritis of the Left  hip.   PROCEDURE: Left total hip arthroplasty, anterior approach.   SURGEON: Gaynelle Arabian, MD   ASSISTANT: Arlee Muslim, PA-C  ANESTHESIA:  Spinal  ESTIMATED BLOOD LOSS:- 200 ml  DRAINS: Hemovac x1.   COMPLICATIONS: None   CONDITION: PACU - hemodynamically stable.   BRIEF CLINICAL NOTE: Makayla Marshall is a 53 y.o. female who has advanced end-  stage arthritis of her Left  hip with progressively worsening pain and  dysfunction.The patient has failed nonoperative management and presents for  total hip arthroplasty.   PROCEDURE IN DETAIL: After successful administration of spinal  anesthetic, the traction boots for the [facility] bed were placed on both  feet and the patient was placed onto the [facility] bed, boots placed into the leg  holders. The Left hip was then isolated from the perineum with plastic  drapes and prepped and draped in the usual sterile fashion. ASIS and  greater trochanter were marked and a oblique incision was made, starting  at about 1 cm lateral and 2 cm distal to the ASIS and coursing towards  the anterior cortex of the femur. The skin was cut with a 10 blade  through subcutaneous tissue to the level of the fascia overlying the  tensor fascia lata muscle. The fascia was then incised in line with the  incision at the junction of the anterior third and posterior 2/3rd. The  muscle was teased off the fascia and then the interval between the TFL  and the rectus was developed. The Hohmann retractor was then placed at  the top of the femoral neck over the capsule. The vessels overlying the  capsule were cauterized and the fat on top of the capsule was removed.  A Hohmann retractor was then placed anterior underneath the rectus  femoris to give exposure to the entire anterior capsule. A T-shaped  capsulotomy was performed. The  edges were tagged and the femoral head  was identified.       Osteophytes are removed off the superior acetabulum.  The femoral neck was then cut in situ with an oscillating saw. Traction  was then applied to the left lower extremity utilizing the [facility]  traction. The femoral head was then removed. Retractors were placed  around the acetabulum and then circumferential removal of the labrum was  performed. Osteophytes were also removed. Reaming starts at 45 mm to  medialize and  Increased in 2 mm increments to 47 mm. We reamed in  approximately 40 degrees of abduction, 20 degrees anteversion. A 48 mm  pinnacle acetabular shell was then impacted in anatomic position under  fluoroscopic guidance with excellent purchase. We did not need to place  any additional dome screws. A 28 mm neutral + 4  marathon liner was then  placed into the acetabular shell.       The femoral lift was then placed along the lateral aspect of the femur  just distal to the vastus ridge. The leg was  externally rotated and capsule  was stripped off the inferior aspect of the femoral neck down to the  level of the lesser trochanter, this was done with electrocautery. The femur was lifted after this was performed. The  leg was then placed and extended in adducted position to essentially delivering the femur. We also removed the capsule superiorly and the  piriformis from the piriformis  fossa to gain excellent exposure of the  proximal femur. Rongeur was used to remove some cancellous bone to get  into the lateral portion of the proximal femur for placement of the  initial starter reamer. The starter broaches was placed  the starter broach  and was shown to go down the center of the canal. Broaching  with the  Corail system was then performed starting at size 8, which was the appropriate size. A size 8 had excellent torsional and rotational  and axial stability. The trial standard offset neck was then placed  with a 28 + 1.5  trial head. The hip was then reduced. We confirmed that  the stem was in the canal both on AP and lateral x-rays. It also has excellent sizing. The hip was reduced with outstanding stability through full extension, full external rotation,  and then flexion in adduction internal rotation. AP pelvis was taken  and the leg lengths were measured and found to be exactly equal. Hip  was then dislocated again and the femoral head and neck removed. The  femoral broach was removed. Size 8 Corail stem with a standard offset  neck was then impacted into the femur following native anteversion. Has  excellent purchase in the canal. Excellent torsional and rotational and  axial stability. It is confirmed to be in the canal on AP and lateral  fluoroscopic views. The 28 + 1.5 ceramic head was placed and the hip  reduced with outstanding stability. Again AP pelvis was taken and it  confirmed that the leg lengths were equal. The wound was then copiously  irrigated with saline solution and the capsule reattached and repaired  with Ethibond suture.  20 mL of Exparel mixed with 50 mL of saline then additional 20 ml of .25% Bupivicaine injected into the capsule and into the edge of the tensor fascia lata as well as subcutaneous tissue. The fascia overlying the tensor fascia lata was  then closed with a running #1 V-Loc. Subcu was closed with interrupted  2-0 Vicryl and subcuticular running 4-0 Monocryl. Incision was cleaned  and dried. Steri-Strips and a bulky sterile dressing applied. Hemovac  drain was hooked to suction and then he was awakened and transported to  recovery in stable condition.        Please note that a surgical assistant was a medical necessity for this procedure to perform it in a safe and expeditious manner. Assistant was necessary to provide appropriate retraction of vital neurovascular structures and to prevent femoral fracture and allow for anatomic placement of the prosthesis.  Gaynelle Arabian,  M.D.

## 2014-09-07 NOTE — Anesthesia Preprocedure Evaluation (Addendum)
Anesthesia Evaluation  Patient identified by MRN, date of birth, ID band Patient awake    Reviewed: Allergy & Precautions, H&P , NPO status , Patient's Chart, lab work & pertinent test results  History of Anesthesia Complications (+) POST - OP SPINAL HEADACHE  Airway Mallampati: II  TM Distance: >3 FB Neck ROM: Full    Dental no notable dental hx.    Pulmonary neg pulmonary ROS,  breath sounds clear to auscultation  Pulmonary exam normal       Cardiovascular negative cardio ROS  Rhythm:Regular Rate:Normal     Neuro/Psych negative neurological ROS  negative psych ROS   GI/Hepatic negative GI ROS, Neg liver ROS,   Endo/Other  negative endocrine ROS  Renal/GU negative Renal ROS  negative genitourinary   Musculoskeletal negative musculoskeletal ROS (+)   Abdominal   Peds negative pediatric ROS (+)  Hematology negative hematology ROS (+)   Anesthesia Other Findings   Reproductive/Obstetrics negative OB ROS                             Anesthesia Physical Anesthesia Plan  ASA: II  Anesthesia Plan: Spinal   Post-op Pain Management:    Induction:   Airway Management Planned:   Additional Equipment:   Intra-op Plan:   Post-operative Plan:   Informed Consent: I have reviewed the patients History and Physical, chart, labs and discussed the procedure including the risks, benefits and alternatives for the proposed anesthesia with the patient or authorized representative who has indicated his/her understanding and acceptance.   Dental advisory given  Plan Discussed with: CRNA  Anesthesia Plan Comments:        Anesthesia Quick Evaluation

## 2014-09-07 NOTE — Interval H&P Note (Signed)
History and Physical Interval Note:  09/07/2014 12:49 PM  Makayla Marshall  has presented today for surgery, with the diagnosis of OA LEFT HIP   The various methods of treatment have been discussed with the patient and family. After consideration of risks, benefits and other options for treatment, the patient has consented to  Procedure(s): LEFT TOTAL HIP ARTHROPLASTY ANTERIOR APPROACH (Left) as a surgical intervention .  The patient's history has been reviewed, patient examined, no change in status, stable for surgery.  I have reviewed the patient's chart and labs.  Questions were answered to the patient's satisfaction.     Gearlean Alf

## 2014-09-07 NOTE — Anesthesia Procedure Notes (Addendum)
Spinal Patient location during procedure: OR Start time: 09/07/2014 1:30 PM End time: 09/07/2014 1:36 PM Staffing Resident/CRNA: Anne Fu Performed by: resident/CRNA  Preanesthetic Checklist Completed: patient identified, site marked, surgical consent, pre-op evaluation, timeout performed, IV checked, risks and benefits discussed and monitors and equipment checked Spinal Block Patient position: sitting Prep: Betadine Patient monitoring: heart rate, continuous pulse ox and blood pressure Approach: right paramedian Location: L2-3 Injection technique: single-shot Needle Needle type: Whitacre  Needle gauge: 25 G Needle length: 9 cm Assessment Sensory level: T6 Additional Notes Expiration date of kit checked and confirmed. Patient tolerated procedure well, without complications. X 2 attempts with noted clear CSF easy aspiration and administration noted loss of motor and sensory on exam.

## 2014-09-08 ENCOUNTER — Encounter (HOSPITAL_COMMUNITY): Payer: Self-pay | Admitting: Orthopedic Surgery

## 2014-09-08 LAB — CBC
HCT: 36.5 % (ref 36.0–46.0)
Hemoglobin: 12.3 g/dL (ref 12.0–15.0)
MCH: 30.8 pg (ref 26.0–34.0)
MCHC: 33.7 g/dL (ref 30.0–36.0)
MCV: 91.5 fL (ref 78.0–100.0)
PLATELETS: 316 10*3/uL (ref 150–400)
RBC: 3.99 MIL/uL (ref 3.87–5.11)
RDW: 11.8 % (ref 11.5–15.5)
WBC: 13.5 10*3/uL — AB (ref 4.0–10.5)

## 2014-09-08 LAB — BASIC METABOLIC PANEL
ANION GAP: 12 (ref 5–15)
BUN: 7 mg/dL (ref 6–23)
CALCIUM: 9 mg/dL (ref 8.4–10.5)
CO2: 26 mEq/L (ref 19–32)
Chloride: 100 mEq/L (ref 96–112)
Creatinine, Ser: 0.51 mg/dL (ref 0.50–1.10)
GFR calc non Af Amer: >90 mL/min (ref >90)
Glucose, Bld: 165 mg/dL — ABNORMAL HIGH (ref 70–99)
Potassium: 4.2 mEq/L (ref 3.7–5.3)
Sodium: 138 mEq/L (ref 137–147)

## 2014-09-08 MED ORDER — OXYCODONE HCL 5 MG PO TABS: 5.0000 mg | ORAL_TABLET | ORAL | Status: DC | PRN

## 2014-09-08 MED ORDER — METHOCARBAMOL 500 MG PO TABS: 500.0000 mg | ORAL_TABLET | Freq: Four times a day (QID) | ORAL | Status: DC | PRN

## 2014-09-08 MED ORDER — TRAMADOL HCL 50 MG PO TABS: 50.0000 mg | ORAL_TABLET | Freq: Four times a day (QID) | ORAL | Status: DC | PRN

## 2014-09-08 MED ORDER — SODIUM CHLORIDE 0.9 % IV BOLUS (SEPSIS)
250.0000 mL | Freq: Once | INTRAVENOUS | Status: AC
  Administered 2014-09-08: 250 mL via INTRAVENOUS

## 2014-09-08 MED ORDER — RIVAROXABAN 10 MG PO TABS: 10.0000 mg | ORAL_TABLET | Freq: Every day | ORAL | Status: DC

## 2014-09-08 MED ORDER — OXYCODONE HCL 5 MG PO TABS
5.0000 mg | ORAL_TABLET | ORAL | Status: DC | PRN
  Administered 2014-09-08 – 2014-09-09 (×8): 15 mg via ORAL
  Filled 2014-09-08 (×9): qty 3

## 2014-09-08 NOTE — Discharge Instructions (Addendum)
Dr. Gaynelle Arabian Total Joint Specialist [facility] [address] [place],  [address] [phone]    ANTERIOR APPROACH TOTAL HIP REPLACEMENT POSTOPERATIVE DIRECTIONS   Hip Rehabilitation, Guidelines Following Surgery  The results of a hip operation are greatly improved after range of motion and muscle strengthening exercises. Follow all safety measures which are given to protect your hip. If any of these exercises cause increased pain or swelling in your joint, decrease the amount until you are comfortable again. Then slowly increase the exercises. Call your caregiver if you have problems or questions.  HOME CARE INSTRUCTIONS  Most of the following instructions are designed to prevent the dislocation of your new hip.  Remove items at home which could result in a fall. This includes throw rugs or furniture in walking pathways.  Continue medications as instructed at time of discharge.  You may have some home medications which will be placed on hold until you complete the course of blood thinner medication.  You may start showering once you are discharged home but do not submerge the incision under water. Just pat the incision dry and apply a dry gauze dressing on daily. Do not put on socks or shoes without following the instructions of your caregivers.  Sit on high chairs which makes it easier to stand.  Sit on chairs with arms. Use the chair arms to help push yourself up when arising.  Keep your leg on the side of the operation out in front of you when standing up.  Arrange for the use of a toilet seat elevator so you are not sitting low.    Walk with walker as instructed.  You may resume a sexual relationship in one month or when given the OK by your caregiver.  Use walker as long as suggested by your caregivers.  You may put full weight on your legs and walk as much as is comfortable. Avoid periods of inactivity such as sitting longer than an hour  when not asleep. This helps prevent blood clots.  You may return to work once you are cleared by Engineer, production.  Do not drive a car for 6 weeks or until released by your surgeon.  Do not drive while taking narcotics.  Wear elastic stockings for three weeks following surgery during the day but you may remove then at night.  Make sure you keep all of your appointments after your operation with all of your doctors and caregivers. You should call the office at the above phone number and make an appointment for approximately two weeks after the date of your surgery. Change the dressing daily and reapply a dry dressing each time. Please pick up a stool softener and laxative for home use as long as you are requiring pain medications.  ICE to the affected hip every three hours for 30 minutes at a time and then as needed for pain and swelling.  Continue to use ice on the hip for pain and swelling from surgery. You may notice swelling that will progress down to the foot and ankle.  This is normal after surgery.  Elevate the leg when you are not up walking on it.   It is important for you to complete the blood thinner medication as prescribed by your doctor.  Continue to use the breathing machine which will help keep your temperature down.  It is common for your temperature to cycle up and down following surgery, especially at night when you are not up moving around  and exerting yourself.  The breathing machine keeps your lungs expanded and your temperature down. ° °RANGE OF MOTION AND STRENGTHENING EXERCISES  °These exercises are designed to help you keep full movement of your hip joint. Follow your caregiver's or physical therapist's instructions. Perform all exercises about fifteen times, three times per day or as directed. Exercise both hips, even if you have had only one joint replacement. These exercises can be done on a training (exercise) mat, on the floor, on a table or on a bed. Use whatever works the best  and is most comfortable for you. Use music or television while you are exercising so that the exercises are a pleasant break in your day. This will make your life better with the exercises acting as a break in routine you can look forward to.  °Lying on your back, slowly slide your foot toward your buttocks, raising your knee up off the floor. Then slowly slide your foot back down until your leg is straight again.  °Lying on your back spread your legs as far apart as you can without causing discomfort.  °Lying on your side, raise your upper leg and foot straight up from the floor as far as is comfortable. Slowly lower the leg and repeat.  °Lying on your back, tighten up the muscle in the front of your thigh (quadriceps muscles). You can do this by keeping your leg straight and trying to raise your heel off the floor. This helps strengthen the largest muscle supporting your knee.  °Lying on your back, tighten up the muscles of your buttocks both with the legs straight and with the knee bent at a comfortable angle while keeping your heel on the floor.  ° °SKILLED REHAB INSTRUCTIONS: °If the patient is transferred to a skilled rehab facility following release from the hospital, a list of the current medications will be sent to the facility for the patient to continue.  When discharged from the skilled rehab facility, please have the facility set up the patient's Home Health Physical Therapy prior to being released. Also, the skilled facility will be responsible for providing the patient with their medications at time of release from the facility to include their pain medication, the muscle relaxants, and their blood thinner medication. If the patient is still at the rehab facility at time of the two week follow up appointment, the skilled rehab facility will also need to assist the patient in arranging follow up appointment in our office and any transportation needs. ° °MAKE SURE YOU:  °Understand these instructions.    °Will watch your condition.  °Will get help right away if you are not doing well or get worse. ° °Pick up stool softner and laxative for home use following surgery while on pain medications. °Do not submerge incision under water. °Please use good hand washing techniques while changing dressing each day. °May shower starting three days after surgery. °Please use a clean towel to pat the incision dry following showers. °Continue to use ice for pain and swelling after surgery. °Do not use any lotions or creams on the incision until instructed by your surgeon. °Total Hip Protocol. ° °Take Xarelto for two and a half more weeks, then discontinue Xarelto. °Once the patient has completed the blood thinner regimen, then take a Baby 81 mg Aspirin daily for three more weeks. ° ° °Postoperative Constipation Protocol ° °Constipation - defined medically as fewer than three stools per week and severe constipation as less than one stool per week. ° °  One of the most common issues patients have following surgery is constipation.  Even if you have a regular bowel pattern at home, your normal regimen is likely to be disrupted due to multiple reasons following surgery.  Combination of anesthesia, postoperative narcotics, change in appetite and fluid intake all can affect your bowels.  In order to avoid complications following surgery, here are some recommendations in order to help you during your recovery period. ° °Colace (docusate) - Pick up an over-the-counter form of Colace or another stool softener and take twice a day as long as you are requiring postoperative pain medications.  Take with a full glass of water daily.  If you experience loose stools or diarrhea, hold the colace until you stool forms back up.  If your symptoms do not get better within 1 week or if they get worse, check with your doctor. ° °Dulcolax (bisacodyl) - Pick up over-the-counter and take as directed by the product packaging as needed to assist with the  movement of your bowels.  Take with a full glass of water.  Use this product as needed if not relieved by Colace only.  ° °MiraLax (polyethylene glycol) - Pick up over-the-counter to have on hand.  MiraLax is a solution that will increase the amount of water in your bowels to assist with bowel movements.  Take as directed and can mix with a glass of water, juice, soda, coffee, or tea.  Take if you go more than two days without a movement. °Do not use MiraLax more than once per day. Call your doctor if you are still constipated or irregular after using this medication for 7 days in a row. ° °If you continue to have problems with postoperative constipation, please contact the office for further assistance and recommendations.  If you experience "the worst abdominal pain ever" or develop nausea or vomiting, please contact the office immediatly for further recommendations for treatment. ° ° ° °Information on my medicine - XARELTO® (Rivaroxaban) ° °This medication education was reviewed with me or my healthcare representative as part of my discharge preparation.  The pharmacist that spoke with me during my hospital stay was:  Zeigler, Dustin George, RPH ° °Why was Xarelto® prescribed for you? °Xarelto® was prescribed for you to reduce the risk of blood clots forming after orthopedic surgery. The medical term for these abnormal blood clots is venous thromboembolism (VTE). ° °What do you need to know about xarelto® ? °Take your Xarelto® ONCE DAILY at the same time every day. °You may take it either with or without food. ° °If you have difficulty swallowing the tablet whole, you may crush it and mix in applesauce just prior to taking your dose. ° °Take Xarelto® exactly as prescribed by your doctor and DO NOT stop taking Xarelto® without talking to the doctor who prescribed the medication.  Stopping without other VTE prevention medication to take the place of Xarelto® may increase your risk of developing a clot. ° °After  discharge, you should have regular check-up appointments with your healthcare provider that is prescribing your Xarelto®.   ° °What do you do if you miss a dose? °If you miss a dose, take it as soon as you remember on the same day then continue your regularly scheduled once daily regimen the next day. Do not take two doses of Xarelto® on the same day.  ° °Important Safety Information °A possible side effect of Xarelto® is bleeding. You should call your healthcare provider right away if you   any of the following: ? Bleeding from an injury or your nose that does not stop. ? Unusual colored urine (red or dark brown) or unusual colored stools (red or black). ? Unusual bruising for unknown reasons. ? A serious fall or if you hit your head (even if there is no bleeding).  Some medicines may interact with Xarelto and might increase your risk of bleeding while on Xarelto. To help avoid this, consult your healthcare provider or pharmacist prior to using any new prescription or non-prescription medications, including herbals, vitamins, non-steroidal anti-inflammatory drugs (NSAIDs) and supplements.  This website has more information on Xarelto: [email or web address]

## 2014-09-08 NOTE — Discharge Summary (Signed)
Physician Discharge Summary   Patient ID: Makayla Marshall MRN: [id number] DOB/AGE: 1961-02-09 52 y.o.  Admit date: 09/07/2014 Discharge date: 09/09/2014  Primary Diagnosis:  Osteoarthritis of the Left hip.  Admission Diagnoses:  Past Medical History  Diagnosis Date  . Arthritis   . Chronic neck pain   . Complication of anesthesia     for epidural for childbirth 34 yrs ago, nicked spine and got headache when sat up-got blood patch done  . ADHD (attention deficit hyperactivity disorder)   . GERD (gastroesophageal reflux disease)    Discharge Diagnoses:   Principal Problem:   OA (osteoarthritis) of hip  Estimated body mass index is 30.88 kg/(m^2) as calculated from the following:   Height as of this encounter: '5\' 4"'  (1.626 m).   Weight as of this encounter: 81.647 kg (180 lb).  Procedure(s) (LRB): LEFT TOTAL HIP ARTHROPLASTY ANTERIOR APPROACH (Left)   Consults: None  HPI: Makayla Marshall is a 53 y.o. female who has advanced end-  stage arthritis of her Left hip with progressively worsening pain and  dysfunction.The patient has failed nonoperative management and presents for  total hip arthroplasty.  Laboratory Data: Admission on 09/07/2014, Discharged on 09/09/2014  Component Date Value Ref Range Status  . WBC 09/08/2014 13.5* 4.0 - 10.5 K/uL Final  . RBC 09/08/2014 3.99  3.87 - 5.11 MIL/uL Final  . Hemoglobin 09/08/2014 12.3  12.0 - 15.0 g/dL Final  . HCT 09/08/2014 36.5  36.0 - 46.0 % Final  . MCV 09/08/2014 91.5  78.0 - 100.0 fL Final  . MCH 09/08/2014 30.8  26.0 - 34.0 pg Final  . MCHC 09/08/2014 33.7  30.0 - 36.0 g/dL Final  . RDW 09/08/2014 11.8  11.5 - 15.5 % Final  . Platelets 09/08/2014 316  150 - 400 K/uL Final  . Sodium 09/08/2014 138  137 - 147 mEq/L Final  . Potassium 09/08/2014 4.2  3.7 - 5.3 mEq/L Final  . Chloride 09/08/2014 100  96 - 112 mEq/L Final  . CO2 09/08/2014 26  19 - 32 mEq/L Final  . Glucose, Bld 09/08/2014 165* 70 - 99 mg/dL Final  . BUN  09/08/2014 7  6 - 23 mg/dL Final  . Creatinine, Ser 09/08/2014 0.51  0.50 - 1.10 mg/dL Final  . Calcium 09/08/2014 9.0  8.4 - 10.5 mg/dL Final  . GFR calc non Af Amer 09/08/2014 >90  >90 mL/min Final  . GFR calc Af Amer 09/08/2014 >90  >90 mL/min Final   Comment: (NOTE) The eGFR has been calculated using the CKD EPI equation. This calculation has not been validated in all clinical situations. eGFR's persistently <90 mL/min signify possible Chronic Kidney Disease.   . Anion gap 09/08/2014 12  5 - 15 Final  . WBC 09/09/2014 15.3* 4.0 - 10.5 K/uL Final  . RBC 09/09/2014 3.85* 3.87 - 5.11 MIL/uL Final  . Hemoglobin 09/09/2014 12.0  12.0 - 15.0 g/dL Final  . HCT 09/09/2014 35.7* 36.0 - 46.0 % Final  . MCV 09/09/2014 92.7  78.0 - 100.0 fL Final  . MCH 09/09/2014 31.2  26.0 - 34.0 pg Final  . MCHC 09/09/2014 33.6  30.0 - 36.0 g/dL Final  . RDW 09/09/2014 12.2  11.5 - 15.5 % Final  . Platelets 09/09/2014 301  150 - 400 K/uL Final  . Sodium 09/09/2014 142  137 - 147 mEq/L Final  . Potassium 09/09/2014 4.2  3.7 - 5.3 mEq/L Final  . Chloride 09/09/2014 102  96 - 112 mEq/L Final  .  CO2 09/09/2014 28  19 - 32 mEq/L Final  . Glucose, Bld 09/09/2014 124* 70 - 99 mg/dL Final  . BUN 09/09/2014 9  6 - 23 mg/dL Final  . Creatinine, Ser 09/09/2014 0.58  0.50 - 1.10 mg/dL Final  . Calcium 09/09/2014 9.1  8.4 - 10.5 mg/dL Final  . GFR calc non Af Amer 09/09/2014 >90  >90 mL/min Final  . GFR calc Af Amer 09/09/2014 >90  >90 mL/min Final   Comment: (NOTE) The eGFR has been calculated using the CKD EPI equation. This calculation has not been validated in all clinical situations. eGFR's persistently <90 mL/min signify possible Chronic Kidney Disease.   Georgiann Hahn gap 09/09/2014 12  5 - 15 Final  Hospital Outpatient Visit on 08/31/2014  Component Date Value Ref Range Status  . MRSA, PCR 08/31/2014 NEGATIVE  NEGATIVE Final  . Staphylococcus aureus 08/31/2014 NEGATIVE  NEGATIVE Final   Comment:        The  Xpert SA Assay (FDA approved for NASAL specimens in patients over 10 years of age), is one component of a comprehensive surveillance program.  Test performance has been validated by [employer] for patients greater than or equal to 69 year old. It is not intended to diagnose infection nor to guide or monitor treatment.   Marland Kitchen aPTT 08/31/2014 29  24 - 37 seconds Final  . WBC 08/31/2014 7.0  4.0 - 10.5 K/uL Final  . RBC 08/31/2014 4.61  3.87 - 5.11 MIL/uL Final  . Hemoglobin 08/31/2014 14.2  12.0 - 15.0 g/dL Final  . HCT 08/31/2014 41.9  36.0 - 46.0 % Final  . MCV 08/31/2014 90.9  78.0 - 100.0 fL Final  . MCH 08/31/2014 30.8  26.0 - 34.0 pg Final  . MCHC 08/31/2014 33.9  30.0 - 36.0 g/dL Final  . RDW 08/31/2014 11.9  11.5 - 15.5 % Final  . Platelets 08/31/2014 317  150 - 400 K/uL Final  . Sodium 08/31/2014 138  137 - 147 mEq/L Final  . Potassium 08/31/2014 5.2  3.7 - 5.3 mEq/L Final  . Chloride 08/31/2014 99  96 - 112 mEq/L Final  . CO2 08/31/2014 28  19 - 32 mEq/L Final  . Glucose, Bld 08/31/2014 99  70 - 99 mg/dL Final  . BUN 08/31/2014 12  6 - 23 mg/dL Final  . Creatinine, Ser 08/31/2014 0.60  0.50 - 1.10 mg/dL Final  . Calcium 08/31/2014 10.1  8.4 - 10.5 mg/dL Final  . Total Protein 08/31/2014 7.9  6.0 - 8.3 g/dL Final  . Albumin 08/31/2014 4.2  3.5 - 5.2 g/dL Final  . AST 08/31/2014 18  0 - 37 U/L Final  . ALT 08/31/2014 20  0 - 35 U/L Final  . Alkaline Phosphatase 08/31/2014 90  39 - 117 U/L Final  . Total Bilirubin 08/31/2014 0.3  0.3 - 1.2 mg/dL Final  . GFR calc non Af Amer 08/31/2014 >90  >90 mL/min Final  . GFR calc Af Amer 08/31/2014 >90  >90 mL/min Final   Comment: (NOTE) The eGFR has been calculated using the CKD EPI equation. This calculation has not been validated in all clinical situations. eGFR's persistently <90 mL/min signify possible Chronic Kidney Disease.   . Anion gap 08/31/2014 11  5 - 15 Final  . Prothrombin Time 08/31/2014 13.2  11.6 - 15.2 seconds  Final  . INR 08/31/2014 0.99  0.00 - 1.49 Final  . ABO/RH(D) 08/31/2014 O POS   Final  . Antibody Screen 08/31/2014 NEG  Final  . Sample Expiration 08/31/2014 09/10/2014   Final  . Color, Urine 08/31/2014 YELLOW  YELLOW Final  . APPearance 08/31/2014 CLEAR  CLEAR Final  . Specific Gravity, Urine 08/31/2014 1.004* 1.005 - 1.030 Final  . pH 08/31/2014 6.5  5.0 - 8.0 Final  . Glucose, UA 08/31/2014 NEGATIVE  NEGATIVE mg/dL Final  . Hgb urine dipstick 08/31/2014 NEGATIVE  NEGATIVE Final  . Bilirubin Urine 08/31/2014 NEGATIVE  NEGATIVE Final  . Ketones, ur 08/31/2014 NEGATIVE  NEGATIVE mg/dL Final  . Protein, ur 08/31/2014 NEGATIVE  NEGATIVE mg/dL Final  . Urobilinogen, UA 08/31/2014 0.2  0.0 - 1.0 mg/dL Final  . Nitrite 08/31/2014 NEGATIVE  NEGATIVE Final  . Leukocytes, UA 08/31/2014 TRACE* NEGATIVE Final  . Squamous Epithelial / LPF 08/31/2014 RARE  RARE Final  . WBC, UA 08/31/2014 0-2  <3 WBC/hpf Final  . ABO/RH(D) 08/31/2014 O POS   Final     X-Rays:Dg Pelvis Portable  09/07/2014   CLINICAL DATA:  Left hip replacement  EXAM: PORTABLE PELVIS 1-2 VIEWS  COMPARISON:  None.  FINDINGS: Left hip replacement in satisfactory position and alignment. No fracture or immediate complication. Surgical drain in the soft tissues  Chronic healed fracture of the right proximal humerus. Mild degenerative change in the right hip joint.  IMPRESSION: Satisfactory left hip replacement.   Electronically Signed   By: Franchot Gallo M.D.   On: 09/07/2014 16:22   Dg C-arm 61-120 Min-no Report  09/07/2014   CLINICAL DATA: surgery   C-ARM 61-120 MINUTES  Fluoroscopy was utilized by the requesting physician.  No radiographic  interpretation.     EKG: Orders placed or performed during the hospital encounter of 06/22/13  . EKG 12-Lead  . EKG 12-Lead  . EKG     Hospital Course: Patient was admitted to [facility] and taken to the OR and underwent the above state procedure without complications.   Patient tolerated the procedure well and was later transferred to the recovery room and then to the orthopaedic floor for postoperative care.  They were given PO and IV analgesics for pain control following their surgery.  They were given 24 hours of postoperative antibiotics of  Anti-infectives    Start     Dose/Rate Route Frequency Ordered Stop   09/07/14 2000  ceFAZolin (ANCEF) IVPB 2 g/50 mL premix     2 g100 mL/hr over 30 Minutes Intravenous Every 6 hours 09/07/14 1720 09/08/14 0309   09/07/14 1136  ceFAZolin (ANCEF) IVPB 2 g/50 mL premix     2 g100 mL/hr over 30 Minutes Intravenous On call to O.R. 09/07/14 1136 09/07/14 1328     and started on DVT prophylaxis in the form of Xarelto.   PT and OT were ordered for total hip protocol.  The patient was allowed to be WBAT with therapy. Discharge planning was consulted to help with postop disposition and equipment needs.  Patient had a decent night on the evening of surgery.  They started to get up OOB with therapy on day one.  Hemovac drain was pulled without difficulty.  Continued to work with therapy into day two.  Dressing was changed on day two and the incision was healing well.  Patient was seen in rounds on day two and was ready to go home.  Discharge home with home health Diet - Regular diet Follow up - in 2 weeks Activity - WBAT Disposition - Home Condition Upon Discharge - Good D/C Meds - See DC Summary DVT Prophylaxis -  Xarelto      Discharge Instructions    Call MD / Call 911    Complete by:  As directed   If you experience chest pain or shortness of breath, CALL 911 and be transported to the hospital emergency room.  If you develope a fever above 101 F, pus (white drainage) or increased drainage or redness at the wound, or calf pain, call your surgeon's office.     Change dressing    Complete by:  As directed   You may change your dressing dressing daily with sterile 4 x 4 inch gauze dressing and paper tape.  Do not submerge the  incision under water.     Constipation Prevention    Complete by:  As directed   Drink plenty of fluids.  Prune juice may be helpful.  You may use a stool softener, such as Colace (over the counter) 100 mg twice a day.  Use MiraLax (over the counter) for constipation as needed.     Diet general    Complete by:  As directed      Discharge instructions    Complete by:  As directed   Pick up stool softner and laxative for home use following surgery while on pain medications. Do not submerge incision under water. Please use good hand washing techniques while changing dressing each day. May shower starting three days after surgery. Please use a clean towel to pat the incision dry following showers. Continue to use ice for pain and swelling after surgery. Do not use any lotions or creams on the incision until instructed by your surgeon.   Total Hip Protocol.  Take Xarelto for two and a half more weeks, then discontinue Xarelto. Once the patient has completed the blood thinner regimen, then take a Baby 81 mg Aspirin daily for three more weeks.   Postoperative Constipation Protocol  Constipation - defined medically as fewer than three stools per week and severe constipation as less than one stool per week.  One of the most common issues patients have following surgery is constipation.  Even if you have a regular bowel pattern at home, your normal regimen is likely to be disrupted due to multiple reasons following surgery.  Combination of anesthesia, postoperative narcotics, change in appetite and fluid intake all can affect your bowels.  In order to avoid complications following surgery, here are some recommendations in order to help you during your recovery period.  Colace (docusate) - Pick up an over-the-counter form of Colace or another stool softener and take twice a day as long as you are requiring postoperative pain medications.  Take with a full glass of water daily.  If you experience  loose stools or diarrhea, hold the colace until you stool forms back up.  If your symptoms do not get better within 1 week or if they get worse, check with your doctor.  Dulcolax (bisacodyl) - Pick up over-the-counter and take as directed by the product packaging as needed to assist with the movement of your bowels.  Take with a full glass of water.  Use this product as needed if not relieved by Colace only.   MiraLax (polyethylene glycol) - Pick up over-the-counter to have on hand.  MiraLax is a solution that will increase the amount of water in your bowels to assist with bowel movements.  Take as directed and can mix with a glass of water, juice, soda, coffee, or tea.  Take if you go more than two days without a  movement. Do not use MiraLax more than once per day. Call your doctor if you are still constipated or irregular after using this medication for 7 days in a row.  If you continue to have problems with postoperative constipation, please contact the office for further assistance and recommendations.  If you experience "the worst abdominal pain ever" or develop nausea or vomiting, please contact the office immediatly for further recommendations for treatment.     Do not sit on low chairs, stoools or toilet seats, as it may be difficult to get up from low surfaces    Complete by:  As directed      Driving restrictions    Complete by:  As directed   No driving until released by the physician.     Increase activity slowly as tolerated    Complete by:  As directed      Lifting restrictions    Complete by:  As directed   No lifting until released by the physician.     Patient may shower    Complete by:  As directed   You may shower without a dressing once there is no drainage.  Do not wash over the wound.  If drainage remains, do not shower until drainage stops.     TED hose    Complete by:  As directed   Use stockings (TED hose) for 3 weeks on both leg(s).  You may remove them at night for  sleeping.     Weight bearing as tolerated    Complete by:  As directed             Medication List    STOP taking these medications        ALEVE 220 MG Caps  Generic drug:  Naproxen Sodium     Vitamin D (Ergocalciferol) 50000 UNITS Caps capsule  Commonly known as:  DRISDOL      TAKE these medications        amphetamine-dextroamphetamine 20 MG tablet  Commonly known as:  ADDERALL  Take 20 mg by mouth every morning.     diphenhydrAMINE 25 MG tablet  Commonly known as:  BENADRYL  Take 25 mg by mouth at bedtime as needed for sleep.     methocarbamol 500 MG tablet  Commonly known as:  ROBAXIN  Take 1 tablet (500 mg total) by mouth every 6 (six) hours as needed for muscle spasms.     oxyCODONE 5 MG immediate release tablet  Commonly known as:  Oxy IR/ROXICODONE  Take 1-3 tablets (5-15 mg total) by mouth every 3 (three) hours as needed for moderate pain, severe pain or breakthrough pain.     pseudoephedrine-guaifenesin 60-600 MG per tablet  Commonly known as:  MUCINEX D  Take 1 tablet by mouth 2 (two) times daily as needed for congestion.     rivaroxaban 10 MG Tabs tablet  Commonly known as:  XARELTO  - Take 1 tablet (10 mg total) by mouth daily with breakfast. Take Xarelto for two and a half more weeks, then discontinue Xarelto.  - Once the patient has completed the blood thinner regimen, then take a Baby 81 mg Aspirin daily for three more weeks.     traMADol 50 MG tablet  Commonly known as:  ULTRAM  Take 1-2 tablets (50-100 mg total) by mouth every 6 (six) hours as needed (mild pain).       Follow-up Information    Follow up with [facility].   Why:  home health  physical therapy   Contact information:   [address] [place] [address] [phone]       Follow up with Gearlean Alf, MD. Schedule an appointment as soon as possible for a visit in 2 weeks.   Specialty:  Orthopedic Surgery   Why:  Call office at [phone] to setup  appointment with Dr. Wynelle Link the last week of December.   Contact information:   [address] [place] [address] [phone]       Signed: Arlee Muslim, PA-C Orthopaedic Surgery 10/06/2014, 9:30 AM

## 2014-09-08 NOTE — Progress Notes (Signed)
Physical Therapy Treatment Note    09/08/14 1400  PT Visit Information  Last PT Received On 09/08/14  Assistance Needed +1  History of Present Illness Pt is a 53 year old female s/p L direct anterior THA  PT Time Calculation  PT Start Time (ACUTE ONLY) 1408  PT Stop Time (ACUTE ONLY) 1416  PT Time Calculation (min) (ACUTE ONLY) 8 min  Subjective Data  Subjective Pt ambulated in hallway again and performed stairs.  Pt reports feeling better this afternoon.  Precautions  Precautions None  Restrictions  Other Position/Activity Restrictions WBAT  Pain Assessment  Pain Assessment 0-10  Pain Score 6  Pain Location L hip  Pain Descriptors / Indicators Aching;Sore  Pain Intervention(s) Limited activity within patient's tolerance;Monitored during session;Repositioned  Cognition  Arousal/Alertness Awake/alert  Behavior During Therapy WFL for tasks assessed/performed  Overall Cognitive Status Within Functional Limits for tasks assessed  Bed Mobility  Overal bed mobility Needs Assistance  Bed Mobility Sit to Supine  Sit to supine Min assist  General bed mobility comments assist for L LE due to pain  Transfers  Overall transfer level Needs assistance  Equipment used Rolling walker (2 wheeled)  Transfers Sit to/from Stand  Sit to Stand Supervision  Ambulation/Gait  Ambulation/Gait assistance Supervision  Ambulation Distance (Feet) 200 Feet  Assistive device Rolling walker (2 wheeled)  Gait Pattern/deviations Step-through pattern;Antalgic  General Gait Details verbal cues for RW positioning, posture, WBing through RW for pain control  Stairs Yes  Stairs assistance Min guard  Stair Management Two rails;Step to pattern;Forwards  Number of Stairs 5 (x2)  General stair comments verbal cues for sequence and safety  PT - End of Session  Activity Tolerance Patient tolerated treatment well  Patient left in bed;with call bell/phone within reach  PT - Assessment/Plan  PT Plan Current  plan remains appropriate  PT Frequency (ACUTE ONLY) 7X/week  Follow Up Recommendations Home health PT  PT equipment None recommended by PT  PT Goal Progression  Progress towards PT goals Progressing toward goals  PT General Charges  $$ ACUTE PT VISIT 1 Procedure  PT Treatments  $Gait Training 8-22 mins   Carmelia Bake, PT, DPT 09/08/2014 Pager: [phone]

## 2014-09-08 NOTE — Progress Notes (Signed)
   Subjective: 1 Day Post-Op Procedure(s) (LRB): LEFT TOTAL HIP ARTHROPLASTY ANTERIOR APPROACH (Left) Patient reports pain as mild.   Patient seen in rounds for Dr. Wynelle Link. Patient is well, but has had some minor complaints of pain in the hpi, requiring pain medications We will start therapy today.  Plan is to go Home after hospital stay.  Objective: Vital signs in last 24 hours: Temp:  [97.5 F (36.4 C)-98.5 F (36.9 C)] 98.1 F (36.7 C) (12/15 1400) Pulse Rate:  [75-98] 77 (12/15 1400) Resp:  [16-20] 16 (12/15 1400) BP: (100-119)/(59-72) 119/68 mmHg (12/15 1400) SpO2:  [98 %-100 %] 99 % (12/15 1400)  Intake/Output from previous day:  Intake/Output Summary (Last 24 hours) at 09/08/14 1917 Last data filed at 09/08/14 1747  Gross per 24 hour  Intake 1692.5 ml  Output   2895 ml  Net -1202.5 ml    Labs:  Recent Labs  09/08/14 0414  HGB 12.3    Recent Labs  09/08/14 0414  WBC 13.5*  RBC 3.99  HCT 36.5  PLT 316    Recent Labs  09/08/14 0414  NA 138  K 4.2  CL 100  CO2 26  BUN 7  CREATININE 0.51  GLUCOSE 165*  CALCIUM 9.0   No results for input(s): LABPT, INR in the last 72 hours.  EXAM General - Patient is Alert, Appropriate and Oriented Extremity - Neurovascular intact Sensation intact distally Dressing - dressing C/D/I Motor Function - intact, moving foot and toes well on exam.  Hemovac pulled without difficulty.  Past Medical History  Diagnosis Date  . Arthritis   . Chronic neck pain   . Complication of anesthesia     for epidural for childbirth 34 yrs ago, nicked spine and got headache when sat up-got blood patch done  . ADHD (attention deficit hyperactivity disorder)   . GERD (gastroesophageal reflux disease)     Assessment/Plan: 1 Day Post-Op Procedure(s) (LRB): LEFT TOTAL HIP ARTHROPLASTY ANTERIOR APPROACH (Left) Principal Problem:   OA (osteoarthritis) of hip  Estimated body mass index is 30.88 kg/(m^2) as calculated from the  following:   Height as of this encounter: 5\' 4"  (1.626 m).   Weight as of this encounter: 81.647 kg (180 lb). Up with therapy Plan for discharge tomorrow Discharge home with home health  DVT Prophylaxis - Xarelto Weight Bearing As Tolerated left Leg Hemovac Pulled Begin Therapy  Arlee Muslim, PA-C Orthopaedic Surgery 09/08/2014, 7:17 PM

## 2014-09-08 NOTE — Evaluation (Signed)
Physical Therapy Evaluation Patient Details Name: Makayla Marshall MRN: [id number] DOB: 01-18-1961 Today's Date: 09/08/2014   History of Present Illness  Pt is a 53 year old female s/p L direct anterior THA  Clinical Impression  Pt is s/p L THA resulting in the deficits listed below (see PT Problem List).  Pt will benefit from skilled PT to increase their independence and safety with mobility to allow discharge to the venue listed below.  Pt able to ambulate in hallway and performed LE exercises.  Pt hopes to d/c home tomorrow.     Follow Up Recommendations Home health PT    Equipment Recommendations  None recommended by PT    Recommendations for Other Services       Precautions / Restrictions Precautions Precautions: None Restrictions Weight Bearing Restrictions: No Other Position/Activity Restrictions: WBAT      Mobility  Bed Mobility Overal bed mobility: Needs Assistance Bed Mobility: Supine to Sit     Supine to sit: Min assist     General bed mobility comments: assist for L LE due to pain, as well as pt attempting to quickly get to bathroom  Transfers Overall transfer level: Needs assistance Equipment used: Rolling walker (2 wheeled) Transfers: Sit to/from Stand Sit to Stand: Min assist;Min guard         General transfer comment: verbal cues for safe technique, improved once pt able to use bathroom and not rushing  Ambulation/Gait Ambulation/Gait assistance: Min guard Ambulation Distance (Feet): 150 Feet Assistive device: Rolling walker (2 wheeled) Gait Pattern/deviations: Step-to pattern;Antalgic;Decreased stance time - left     General Gait Details: verbal cues for sequence, RW positioning, posture, WBing through RW for pain control  Stairs            Wheelchair Mobility    Modified Rankin (Stroke Patients Only)       Balance                                             Pertinent Vitals/Pain Pain Assessment:  0-10 Pain Score: 6  Pain Location: L hip Pain Descriptors / Indicators: Sore;Aching Pain Intervention(s): Limited activity within patient's tolerance;Monitored during session;Premedicated before session;Repositioned;Ice applied    Home Living Family/patient expects to be discharged to:: Private residence Living Arrangements: Spouse/significant other   Type of Home: House Home Access: Stairs to enter Entrance Stairs-Rails: Right Entrance Stairs-Number of Steps: 4 Home Layout: Two level Home Equipment: Bedside commode;Walker - 2 wheels;Crutches Additional Comments: pt checking to see if she can borrow AE    Prior Function Level of Independence: Independent               Hand Dominance        Extremity/Trunk Assessment   Upper Extremity Assessment: Overall WFL for tasks assessed           Lower Extremity Assessment: LLE deficits/detail   LLE Deficits / Details: functional hip weakness observed, hip at least 2+/5 throughout as pt able to move against gravity however not full range     Communication   Communication: No difficulties  Cognition Arousal/Alertness: Awake/alert Behavior During Therapy: WFL for tasks assessed/performed Overall Cognitive Status: Within Functional Limits for tasks assessed                      General Comments      Exercises Total Joint Exercises Ankle  Circles/Pumps: AROM;Both;15 reps Quad Sets: AROM;Both;15 reps Heel Slides: AAROM;Left;15 reps Hip ABduction/ADduction: AAROM;Left;15 reps Long Arc Quad: AROM;Seated;Left;15 reps      Assessment/Plan    PT Assessment Patient needs continued PT services  PT Diagnosis Difficulty walking;Acute pain   PT Problem List Decreased strength;Decreased mobility;Pain;Decreased knowledge of use of DME  PT Treatment Interventions Functional mobility training;Stair training;Gait training;DME instruction;Patient/family education;Therapeutic activities;Therapeutic exercise   PT Goals  (Current goals can be found in the Care Plan section) Acute Rehab PT Goals Patient Stated Goal: home PT Goal Formulation: With patient Time For Goal Achievement: 09/11/14 Potential to Achieve Goals: Good    Frequency 7X/week   Barriers to discharge        Co-evaluation               End of Session   Activity Tolerance: Patient tolerated treatment well Patient left: with call bell/phone within reach;in chair Nurse Communication: Patient requests pain meds         Time: 4163-8453 PT Time Calculation (min) (ACUTE ONLY): 30 min   Charges:   PT Evaluation $Initial PT Evaluation Tier I: 1 Procedure PT Treatments $Gait Training: 8-22 mins $Therapeutic Exercise: 8-22 mins   PT G Codes:          Zebediah Beezley,KATHrine E 09/08/2014, 1:09 PM Carmelia Bake, PT, DPT 09/08/2014 Pager: [phone]

## 2014-09-08 NOTE — Evaluation (Signed)
Occupational Therapy Evaluation Patient Details Name: Makayla Marshall MRN: [id number] DOB: 10/02/1960 Today's Date: 09/08/2014    History of Present Illness Pt is s/p L direct anterior THA   Clinical Impression   Pt doing well up to the bathroom to practice toilet transfers with 3in1 and walker. Pt will benefit from continued OT to progress ADL independence for return home with family.    Follow Up Recommendations  No OT follow up;Supervision/Assistance - 24 hour    Equipment Recommendations  None recommended by OT    Recommendations for Other Services       Precautions / Restrictions Precautions Precautions: None Restrictions Weight Bearing Restrictions: No      Mobility Bed Mobility                  Transfers Overall transfer level: Needs assistance Equipment used: Rolling walker (2 wheeled) Transfers: Sit to/from Stand Sit to Stand: Min guard         General transfer comment: verbal cues for safety    Balance                                            ADL Overall ADL's : Needs assistance/impaired Eating/Feeding: Independent;Sitting   Grooming: Wash/dry hands;Min guard;Standing   Upper Body Bathing: Set up;Sitting   Lower Body Bathing: Minimal assistance;Sit to/from stand   Upper Body Dressing : Set up;Sitting   Lower Body Dressing: Minimal assistance;Sit to/from stand   Toilet Transfer: Min guard;Ambulation;BSC;RW   Toileting- Water quality scientist and Hygiene: Min guard;Sit to/from stand         General ADL Comments: Instructed pt on AE options for LB self care. She think she may be able to borrow AE pieces and will check with neighbors. Demonstrated all AE pieces. Pt needs min cues to keep walker on the floor during functional transfers. Also discussed how to adjust 3in1 for appropriate height.      Vision                     Perception     Praxis      Pertinent Vitals/Pain Pain Assessment: 0-10 Pain  Score: 8  Pain Location: L hip Pain Descriptors / Indicators: Aching Pain Intervention(s): Patient requesting pain meds-RN notified;Repositioned;Ice applied     Hand Dominance     Extremity/Trunk Assessment Upper Extremity Assessment Upper Extremity Assessment: Overall WFL for tasks assessed           Communication Communication Communication: No difficulties   Cognition Arousal/Alertness: Awake/alert Behavior During Therapy: WFL for tasks assessed/performed Overall Cognitive Status: Within Functional Limits for tasks assessed                     General Comments       Exercises       Shoulder Instructions      Home Living Family/patient expects to be discharged to:: Private residence Living Arrangements: Spouse/significant other   Type of Home: House             Bathroom Shower/Tub: Occupational psychologist: Standard     Home Equipment: Bedside commode;Walker - 2 wheels   Additional Comments: pt checking to see if she can borrow AE      Prior Functioning/Environment Level of Independence: Independent             OT  Diagnosis: Generalized weakness   OT Problem List: Decreased strength;Decreased knowledge of use of DME or AE   OT Treatment/Interventions: Self-care/ADL training;Patient/family education;Therapeutic activities;DME and/or AE instruction    OT Goals(Current goals can be found in the care plan section) Acute Rehab OT Goals Patient Stated Goal: home OT Goal Formulation: With patient Time For Goal Achievement: 09/15/14 Potential to Achieve Goals: Good  OT Frequency: Min 2X/week   Barriers to D/C:            Co-evaluation              End of Session Equipment Utilized During Treatment: Rolling walker  Activity Tolerance: Patient tolerated treatment well Patient left: in chair;with call bell/phone within reach   Time: 1055-1120 OT Time Calculation (min): 25 min Charges:  OT General Charges $OT Visit:  1 Procedure OT Evaluation $Initial OT Evaluation Tier I: 1 Procedure OT Treatments $Therapeutic Activity: 8-22 mins G-Codes:    Makayla Marshall  [phone] 09/08/2014, 11:28 AM

## 2014-09-08 NOTE — Progress Notes (Signed)
Utilization review completed.  

## 2014-09-08 NOTE — Care Management Note (Signed)
    Page 1 of 2   09/08/2014     10:10:12 AM CARE MANAGEMENT NOTE 09/08/2014  Patient:  Makayla Marshall,Makayla Marshall   Account Number:  [id number]  Date Initiated:  09/08/2014  Documentation initiated by:  [facility]  Subjective/Objective Assessment:   adm: LEFT TOTAL HIP ARTHROPLASTY ANTERIOR APPROACH (Left)     Action/Plan:   discharge planning   Anticipated DC Date:  09/09/2014   Anticipated DC Plan:  [place]  CM consult      [facility] Choice  HOME HEALTH   Choice offered to / List presented to:  C-1 Patient   DME arranged  NA      DME agency  NA     [place] arranged  HH-2 PT      [place]   Status of service:  Completed, signed off Medicare Important Message given?   (If response is "NO", the following Medicare IM given date fields will be blank) Date Medicare IM given:   Medicare IM given by:   Date Additional Medicare IM given:   Additional Medicare IM given by:    Discharge Disposition:  [place]  Per UR Regulation:    If discussed at Long Length of Stay Meetings, dates discussed:    Comments:  09/08/14 08:45 CM met with pt in room to offer choice of home health agency.  Pt chooses [facility] to render HHPT. Address and contact informaiton verified with pt.  NO DME NEEDED.  Referral called to Shaune Leeks.  No other CM needs were communicated.  Christella Scheuermann, BSN, CM [phone].

## 2014-09-09 LAB — BASIC METABOLIC PANEL
Anion gap: 12 (ref 5–15)
BUN: 9 mg/dL (ref 6–23)
CALCIUM: 9.1 mg/dL (ref 8.4–10.5)
CHLORIDE: 102 meq/L (ref 96–112)
CO2: 28 meq/L (ref 19–32)
CREATININE: 0.58 mg/dL (ref 0.50–1.10)
GFR calc non Af Amer: >90 mL/min (ref >90)
Glucose, Bld: 124 mg/dL — ABNORMAL HIGH (ref 70–99)
Potassium: 4.2 mEq/L (ref 3.7–5.3)
SODIUM: 142 meq/L (ref 137–147)

## 2014-09-09 LAB — CBC
HCT: 35.7 % — ABNORMAL LOW (ref 36.0–46.0)
Hemoglobin: 12 g/dL (ref 12.0–15.0)
MCH: 31.2 pg (ref 26.0–34.0)
MCHC: 33.6 g/dL (ref 30.0–36.0)
MCV: 92.7 fL (ref 78.0–100.0)
Platelets: 301 10*3/uL (ref 150–400)
RBC: 3.85 MIL/uL — AB (ref 3.87–5.11)
RDW: 12.2 % (ref 11.5–15.5)
WBC: 15.3 10*3/uL — AB (ref 4.0–10.5)

## 2014-09-09 MED ORDER — ALUM & MAG HYDROXIDE-SIMETH 200-200-20 MG/5ML PO SUSP
15.0000 mL | ORAL | Status: DC | PRN
  Administered 2014-09-09: 15 mL via ORAL
  Filled 2014-09-09: qty 30

## 2014-09-09 NOTE — Progress Notes (Signed)
Physical Therapy Treatment Patient Details Name: Tilia Faso MRN: [id number] DOB: [birth year] Today's Date: 2014-09-17    History of Present Illness Pt is a 53 year old female s/p L direct anterior THA    PT Comments    Pt mobilizing well.  Pt practiced stair technique again this morning.  Pt performed LE exercises and provided HEP handout.  Pt had no further questions and feels ready for d/c home today.  Follow Up Recommendations  Home health PT     Equipment Recommendations  None recommended by PT    Recommendations for Other Services       Precautions / Restrictions Precautions Precautions: None Restrictions Weight Bearing Restrictions: No Other Position/Activity Restrictions: WBAT    Mobility  Bed Mobility               General bed mobility comments: pt up in recliner on arrival  Transfers Overall transfer level: Needs assistance Equipment used: Rolling walker (2 wheeled) Transfers: Sit to/from Stand Sit to Stand: Supervision            Ambulation/Gait Ambulation/Gait assistance: Supervision Ambulation Distance (Feet): 350 Feet Assistive device: Rolling walker (2 wheeled) Gait Pattern/deviations: Step-through pattern;Antalgic;Decreased step length - right     General Gait Details: verbal cues for RW positioning, posture   Stairs Stairs: Yes Stairs assistance: Min guard Stair Management: One rail Right;Step to pattern;Forwards Number of Stairs: 4 General stair comments: verbal cues for sequence  Wheelchair Mobility    Modified Rankin (Stroke Patients Only)       Balance                                    Cognition Arousal/Alertness: Awake/alert Behavior During Therapy: WFL for tasks assessed/performed (can be a little impulsive at times) Overall Cognitive Status: Within Functional Limits for tasks assessed                      Exercises Total Joint Exercises Ankle Circles/Pumps: AROM;Both;15 reps Quad  Sets: AROM;Both;15 reps Towel Squeeze: AROM;Both;15 reps Heel Slides: Left;15 reps;AROM Hip ABduction/ADduction: Left;15 reps;Standing;AROM Long Arc Quad: AROM;Seated;Left;15 reps Knee Flexion: Standing;Left;15 reps;AROM Marching in Standing: AROM;Standing;Left;15 reps Standing Hip Extension: AROM;Left;15 reps    General Comments        Pertinent Vitals/Pain Pain Assessment: 0-10 Pain Score: 4  Pain Location: L hip Pain Descriptors / Indicators: Sore Pain Intervention(s): Monitored during session;Limited activity within patient's tolerance;Patient requesting pain meds-RN notified;Repositioned    Home Living                      Prior Function            PT Goals (current goals can now be found in the care plan section) Progress towards PT goals: Progressing toward goals    Frequency  7X/week    PT Plan Current plan remains appropriate    Co-evaluation             End of Session   Activity Tolerance: Patient tolerated treatment well Patient left: with call bell/phone within reach;in chair     Time: 5093-2671 PT Time Calculation (min) (ACUTE ONLY): 25 min  Charges:  $Gait Training: 8-22 mins $Therapeutic Exercise: 8-22 mins                    G Codes:      Gracieann Stannard,KATHrine E September 17, 2014, 1:01 PM  Carmelia Bake, PT, DPT 09/09/2014 Pager: [phone]

## 2014-09-09 NOTE — Progress Notes (Signed)
Occupational Therapy Treatment Patient Details Name: Makayla Marshall MRN: [id number] DOB: Aug 21, 1961 Today's Date: 09/09/2014    History of present illness Pt is a 53 year old female s/p L direct anterior THA   OT comments  Pt doing well and supposed to d/c today. Practiced shower transfer and functional transfers. Needs cues to go slower at times and to make sure walker is in front of her before standing up.   Follow Up Recommendations  No OT follow up;Supervision/Assistance - 24 hour    Equipment Recommendations  None recommended by OT    Recommendations for Other Services      Precautions / Restrictions Precautions Precautions: None Restrictions Weight Bearing Restrictions: No Other Position/Activity Restrictions: WBAT       Mobility Bed Mobility                  Transfers Overall transfer level: Needs assistance Equipment used: Rolling walker (2 wheeled) Transfers: Sit to/from Stand Sit to Stand: Supervision         General transfer comment: cues to have walker in front of her before standing. Pt standing up before walker placed in front of her.    Balance                                   ADL                           Toilet Transfer: Supervision/safety;Ambulation;RW       Tub/ Shower Transfer: Min guard;Walk-in shower;Rolling walker     General ADL Comments: Pt states she has all AE to use at home and already used it this am to get dressed. She states she has no questions about how to use AE and that she has slip on shoes for home. Pt stood from recliner without walker in front of her and then was holding to table that rolls. Cautioned pt to make sure that she has walker in front of her BEFORE she stands up for safety. Pt tends to move quickly before instructions given. Cautioned pt to also go slower for safety. Practiced stepping over shower stall with walker and pt did well. She needed min cues for sequence and stepping  back to shower further before attempting step over as well as coming closer to ledge before stepping out.       Vision                     Perception     Praxis      Cognition   Behavior During Therapy: WFL for tasks assessed/performed (can be a little impulsive at times) Overall Cognitive Status: Within Functional Limits for tasks assessed                       Extremity/Trunk Assessment               Exercises     Shoulder Instructions       General Comments      Pertinent Vitals/ Pain       Pain Assessment: 0-10 Pain Score: 3  Pain Location: L hip Pain Descriptors / Indicators: Sore Pain Intervention(s): Repositioned;Ice applied  Home Living  Prior Functioning/Environment              Frequency Min 2X/week     Progress Toward Goals  OT Goals(current goals can now be found in the care plan section)  Progress towards OT goals: Progressing toward goals     Plan Discharge plan remains appropriate    Co-evaluation                 End of Session Equipment Utilized During Treatment: Rolling walker   Activity Tolerance Patient tolerated treatment well   Patient Left in chair;with call bell/phone within reach   Nurse Communication          Time: 5621-3086 OT Time Calculation (min): 12 min  Charges: OT General Charges $OT Visit: 1 Procedure OT Treatments $Therapeutic Activity: 8-22 mins  [name]  [phone] 09/09/2014, 9:00 AM

## 2014-09-09 NOTE — Progress Notes (Signed)
   Subjective: 2 Days Post-Op Procedure(s) (LRB): LEFT TOTAL HIP ARTHROPLASTY ANTERIOR APPROACH (Left) Patient reports pain as moderate.   Patient seen in rounds with Dr. Wynelle Link. Patient is well, and has had no acute complaints or problems Patient is ready to go home  Objective: Vital signs in last 24 hours: Temp:  [98.1 F (36.7 C)-98.6 F (37 C)] 98.4 F (36.9 C) (12/16 0434) Pulse Rate:  [65-77] 67 (12/16 0434) Resp:  [16] 16 (12/16 0434) BP: (98-119)/(57-72) 111/60 mmHg (12/16 0434) SpO2:  [95 %-99 %] 95 % (12/16 0434)  Intake/Output from previous day:  Intake/Output Summary (Last 24 hours) at 09/09/14 0809 Last data filed at 09/08/14 2200  Gross per 24 hour  Intake    720 ml  Output   1100 ml  Net   -380 ml     Labs:  Recent Labs  09/08/14 0414 09/09/14 0355  HGB 12.3 12.0    Recent Labs  09/08/14 0414 09/09/14 0355  WBC 13.5* 15.3*  RBC 3.99 3.85*  HCT 36.5 35.7*  PLT 316 301    Recent Labs  09/08/14 0414 09/09/14 0355  NA 138 142  K 4.2 4.2  CL 100 102  CO2 26 28  BUN 7 9  CREATININE 0.51 0.58  GLUCOSE 165* 124*  CALCIUM 9.0 9.1   No results for input(s): LABPT, INR in the last 72 hours.  EXAM: General - Patient is Alert, Appropriate and Oriented Extremity - Neurovascular intact Sensation intact distally Dorsiflexion/Plantar flexion intact No cellulitis present Incision - clean, dry, no drainage Motor Function - intact, moving foot and toes well on exam.   Assessment/Plan: 2 Days Post-Op Procedure(s) (LRB): LEFT TOTAL HIP ARTHROPLASTY ANTERIOR APPROACH (Left) Procedure(s) (LRB): LEFT TOTAL HIP ARTHROPLASTY ANTERIOR APPROACH (Left) Past Medical History  Diagnosis Date  . Arthritis   . Chronic neck pain   . Complication of anesthesia     for epidural for childbirth 33 yrs ago, nicked spine and got headache when sat up-got blood patch done  . ADHD (attention deficit hyperactivity disorder)   . GERD (gastroesophageal reflux  disease)    Principal Problem:   OA (osteoarthritis) of hip  Estimated body mass index is 30.88 kg/(m^2) as calculated from the following:   Height as of this encounter: 5\' 4"  (1.626 m).   Weight as of this encounter: 81.647 kg (180 lb). Up with therapy Discharge home with home health Diet - Regular diet Follow up - in 2 weeks Activity - WBAT Disposition - Home Condition Upon Discharge - Good D/C Meds - See DC Summary DVT Prophylaxis - Xarelto  Makayla Muslim, PA-C Orthopaedic Surgery 09/09/2014, 8:09 AM

## 2014-11-05 ENCOUNTER — Other Ambulatory Visit: Payer: Self-pay

## 2014-11-05 DIAGNOSIS — Z1231 Encounter for screening mammogram for malignant neoplasm of breast: Secondary | ICD-10-CM

## 2014-11-11 ENCOUNTER — Ambulatory Visit
Admission: RE | Admit: 2014-11-11 | Discharge: 2014-11-11 | Disposition: A | Discharge status: Home / Self Care | Payer: Medicare Other | Source: Ambulatory Visit

## 2014-11-11 ENCOUNTER — Ambulatory Visit: Payer: Self-pay

## 2014-11-11 DIAGNOSIS — Z1231 Encounter for screening mammogram for malignant neoplasm of breast: Secondary | ICD-10-CM

## 2015-07-02 ENCOUNTER — Other Ambulatory Visit (HOSPITAL_COMMUNITY)
Admission: RE | Admit: 2015-07-02 | Discharge: 2015-07-02 | Disposition: A | Discharge status: Home / Self Care | Payer: Medicare Other | Source: Ambulatory Visit | Attending: Family Medicine | Admitting: Family Medicine

## 2015-07-02 ENCOUNTER — Other Ambulatory Visit: Payer: Self-pay | Admitting: Family Medicine

## 2015-07-02 DIAGNOSIS — Z1151 Encounter for screening for human papillomavirus (HPV): Secondary | ICD-10-CM | POA: Insufficient documentation

## 2015-07-02 DIAGNOSIS — Z124 Encounter for screening for malignant neoplasm of cervix: Secondary | ICD-10-CM | POA: Diagnosis present

## 2015-07-05 LAB — CYTOLOGY - PAP

## 2015-08-16 ENCOUNTER — Telehealth: Payer: Self-pay | Admitting: Emergency Medicine

## 2015-08-16 DIAGNOSIS — R87810 Cervical high risk human papillomavirus (HPV) DNA test positive: Principal | ICD-10-CM

## 2015-08-16 DIAGNOSIS — R87610 Atypical squamous cells of undetermined significance on cytologic smear of cervix (ASC-US): Secondary | ICD-10-CM

## 2015-08-16 NOTE — Telephone Encounter (Signed)
Call to patient. Needs colposcopy and new patient appointment with Dr. Sabra Heck.  Pap is in EPIC. Referred from Dr. Drema Dallas at [facility].  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

## 2015-08-17 ENCOUNTER — Telehealth: Payer: Self-pay | Admitting: Obstetrics & Gynecology

## 2015-08-17 NOTE — Telephone Encounter (Signed)
Patient returned call.  Scheduled new patient appointment and colposcopy with Dr. Sabra Heck for 08/23/15 at 1530.  Patient post menopausal.   Brief description of procedure given to patient.  Colposcopy pre-procedure instructions given. Make sure to eat a meal and hydrate before appointment.  Advised 800 mg of Motrin PO with food one hour prior to appointment.    Patient verbalized understanding of preprocedure instructions.  Patient is advised she will be contacted with insurance coverage information. cc Kerry Hough, cc Lavella Hammock Curl for new patient referral processing.  Routing to provider for final review. Patient agreeable to disposition. Will close encounter.

## 2015-08-17 NOTE — Telephone Encounter (Signed)
Called patient and left a message to call back to reschedule her new patient appointment for 08/23/15 to earlier in the day.

## 2015-08-23 ENCOUNTER — Ambulatory Visit: Payer: Medicare Other | Admitting: Obstetrics & Gynecology

## 2015-08-23 ENCOUNTER — Encounter: Payer: Self-pay | Admitting: Obstetrics & Gynecology

## 2015-08-23 ENCOUNTER — Ambulatory Visit (INDEPENDENT_AMBULATORY_CARE_PROVIDER_SITE_OTHER): Payer: Medicare Other | Admitting: Obstetrics & Gynecology

## 2015-08-23 DIAGNOSIS — R87810 Cervical high risk human papillomavirus (HPV) DNA test positive: Secondary | ICD-10-CM | POA: Diagnosis not present

## 2015-08-23 DIAGNOSIS — R87610 Atypical squamous cells of undetermined significance on cytologic smear of cervix (ASC-US): Secondary | ICD-10-CM | POA: Diagnosis not present

## 2015-08-23 NOTE — Progress Notes (Signed)
Patient ID: Makayla Marshall, female   DOB: 1961/06/11, 54 y.o.   MRN: [id number]   54 y.o. G4P0 Single CaucasianF here for evaluation of abnormal Pap smear.  Pt has ASCUS with + HR HPV obtained 07/02/15.  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  H/O HGSIL pap in 2010 in [place].  Denies vaginal bleeding.  No LMP recorded. Patient is postmenopausal.          Sexually active: Yes.    The current method of family planning is tubal ligation.    Exercising: Yes.    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 BMD:   2013, Dr. Drema Dallas did this TDaP:  Unsure but thinks UTD Screening Labs: n/a, Hb today: n/a, Urine today: n/a   reports that she has never smoked. She has never used smokeless tobacco. She reports that she drinks about 2.4 - 3.6 oz of alcohol per week. She reports that she does not use illicit drugs.  Past Medical History  Diagnosis Date  . Arthritis   . Chronic neck pain   . Complication of anesthesia     for epidural for childbirth 34 yrs ago, nicked spine and got headache when sat up-got blood patch done  . ADHD (attention deficit hyperactivity disorder)   . GERD (gastroesophageal reflux disease)     not on medication    Past Surgical History  Procedure Laterality Date  . Cervical spine surgery    . Femur fracture surgery    . Tonsillectomy      age 63  . 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 Tubal ligation      Current Outpatient Prescriptions  Medication Sig Dispense Refill  . amphetamine-dextroamphetamine (ADDERALL) 20 MG tablet Take 20 mg by mouth every morning.    . cholecalciferol  (VITAMIN D) 1000 UNITS tablet Take 1,000 Units by mouth daily.    . diphenhydrAMINE (BENADRYL) 25 MG tablet Take 25 mg by mouth at bedtime as needed for sleep.    . pseudoephedrine-guaifenesin (MUCINEX D) 60-600 MG per tablet Take 1 tablet by mouth 2 (two) times daily as needed for congestion.     No current facility-administered medications for this visit.    Family History  Problem Relation Age of Onset  . Thyroid cancer Mother   . Lung cancer Mother     developed from the radation for thyroid cancer/deceased  . Breast cancer Maternal Aunt     older aunt-diag at age-around 24, triple negative  . Pancreatic cancer Maternal Aunt     younger aunt  . Seizures Maternal Uncle   . Parkinson's disease Paternal Uncle     and dementia    ROS:  Pertinent items are noted in HPI.  Otherwise, a comprehensive ROS was negative.  Exam:   BP 140/82 mmHg  Pulse 80  Resp 16  Ht 5\' 4"  (1.626 m)  Wt 166 lb (75.297 kg)  BMI 28.48 kg/m2  LMP    Height: 5\' 4"  (162.6 cm)  Ht Readings from Last 3 Encounters:  08/23/15 5\' 4"  (1.626 m)  09/07/14 5\' 4"  (  1.626 m)  08/31/14 5\' 4"  (1.626 m)    General appearance: alert, cooperative and appears stated age Neurologic: Grossly normal   Pelvic: External genitalia:  no lesions              Urethra:  normal appearing urethra with no masses, tenderness or lesions              Bartholins and Skenes: normal                 Vagina: normal appearing vagina with normal color and discharge, no lesions              Cervix: no lesions              Pap taken: No.  Patient has been counseled about results and procedure.  Risks and benefits have bene reviewed including immediate and/or delayed bleeding, infection, cervical scaring from procedure, possibility of needing additional follow up as well as treatment.  rare risks of missing a lesion discussed as well.  All questions answered.  Pt ready to proceed.  Speculum placed.  3% acetic acid applied to cervix for  >45 seconds.  Cervix visualized with both 7.5X and 15X magnification.  Green filter also used.  Lugols solution was used.  Findings:  Mild area of AWE at 7-9 o'clock.  No decreased staining with Lugol's.  Biopsy:  At 3 and 8 o'clock.  ECC:  was performed.  Monsel's was needed.  Excellent hemostasis was present.  Pt tolerated procedure well and all instruments were removed.  Findings noted above on picture of cervix.  Chaperone was present for exam.  A:  ASCUS with +HR HPV H/O ADHD H/O GERD  P:   Biopsies and ECC pending.  Results will be called and follow-up recommendations will made at that time.

## 2015-08-30 NOTE — Addendum Note (Signed)
Addended by: Megan Salon on: 08/30/2015 05:07 PM   Modules accepted: Orders

## 2015-08-31 ENCOUNTER — Other Ambulatory Visit: Payer: Self-pay | Admitting: Obstetrics & Gynecology

## 2015-08-31 NOTE — Addendum Note (Signed)
Addended by: Megan Salon on: 08/31/2015 11:19 AM   Modules accepted: Orders

## 2015-09-02 LAB — IPS OTHER TISSUE BIOPSY

## 2016-07-11 ENCOUNTER — Telehealth: Payer: Self-pay | Admitting: Obstetrics & Gynecology

## 2016-07-11 NOTE — Telephone Encounter (Signed)
Release of records from [facility] on [place], [place], received and reviewed.  Chart reviewed.  Pt referred last year for ASCUS with +HR HPV.  Colposcopy performed.  CIN 1 noted with neg ECC.  Documentation in chart does not show pt was notified of results.  I called pt personally to confirm this and she states she was not called but felt "everything must have been ok" so she didn't call for results.  Results were given and recommendation for repeat Pap and HR HPV given.  Pt knows this is due in November.  States she will have done with PA at [facility] on [address].  Will ask for referral if needed at that time.    Pt aware she will received communication about recall in late November or early December and I understand if she chooses to go elsewhere if additional follow up is needed.  Pt appreciative of phone call.

## 2016-08-28 ENCOUNTER — Other Ambulatory Visit (HOSPITAL_COMMUNITY)
Admission: RE | Admit: 2016-08-28 | Discharge: 2016-08-28 | Disposition: A | Discharge status: Home / Self Care | Payer: Medicare Other | Source: Ambulatory Visit | Attending: Family Medicine | Admitting: Family Medicine

## 2016-08-28 ENCOUNTER — Telehealth: Payer: Self-pay | Admitting: *Deleted

## 2016-08-28 ENCOUNTER — Other Ambulatory Visit: Payer: Self-pay | Admitting: Family Medicine

## 2016-08-28 DIAGNOSIS — Z124 Encounter for screening for malignant neoplasm of cervix: Secondary | ICD-10-CM | POA: Insufficient documentation

## 2016-08-28 DIAGNOSIS — Z1151 Encounter for screening for human papillomavirus (HPV): Secondary | ICD-10-CM | POA: Insufficient documentation

## 2016-08-28 NOTE — Telephone Encounter (Signed)
Patient in 08 recall for 07/2016. Spoke with patient and she states she had her PAP done today with her PCP. Please advise of recall status -eh

## 2016-08-30 NOTE — Telephone Encounter (Signed)
Removed from recall per SM -eh

## 2016-08-30 NOTE — Telephone Encounter (Signed)
Out of pap recall.  She will not be returning to our office for care but I spoke with her personally about her need for follow-up and that is why she did this with her PCP.  Thanks.

## 2016-08-31 LAB — CYTOLOGY - PAP
DIAGNOSIS: NEGATIVE
HPV: NOT DETECTED

## 2017-06-01 ENCOUNTER — Other Ambulatory Visit: Payer: Self-pay | Admitting: Family Medicine

## 2017-06-01 DIAGNOSIS — Z1231 Encounter for screening mammogram for malignant neoplasm of breast: Secondary | ICD-10-CM

## 2017-06-13 ENCOUNTER — Ambulatory Visit
Admission: RE | Admit: 2017-06-13 | Discharge: 2017-06-13 | Disposition: A | Discharge status: Home / Self Care | Payer: Medicare Other | Source: Ambulatory Visit | Attending: Family Medicine | Admitting: Family Medicine

## 2017-06-13 DIAGNOSIS — Z1231 Encounter for screening mammogram for malignant neoplasm of breast: Secondary | ICD-10-CM

## 2017-06-14 ENCOUNTER — Other Ambulatory Visit: Payer: Self-pay | Admitting: Family Medicine

## 2017-06-14 DIAGNOSIS — R928 Other abnormal and inconclusive findings on diagnostic imaging of breast: Secondary | ICD-10-CM

## 2017-06-20 ENCOUNTER — Ambulatory Visit
Admission: RE | Admit: 2017-06-20 | Discharge: 2017-06-20 | Disposition: A | Discharge status: Home / Self Care | Payer: Medicare Other | Source: Ambulatory Visit | Attending: Family Medicine | Admitting: Family Medicine

## 2017-06-20 ENCOUNTER — Ambulatory Visit: Payer: Medicare Other

## 2017-06-20 DIAGNOSIS — R928 Other abnormal and inconclusive findings on diagnostic imaging of breast: Secondary | ICD-10-CM

## 2017-12-08 IMAGING — MG 2D DIGITAL DIAGNOSTIC UNILATERAL RIGHT MAMMOGRAM WITH CAD AND AD
8 of 12 series · 8 of 28 positions shown · non-contrast
Comparison: Previous exam(s).

CLINICAL DATA: Screening recall for right breast distortion.

EXAM:
2D DIGITAL DIAGNOSTIC UNILATERAL RIGHT MAMMOGRAM WITH CAD AND
ADJUNCT TOMO

[R CC (1 of 2)]
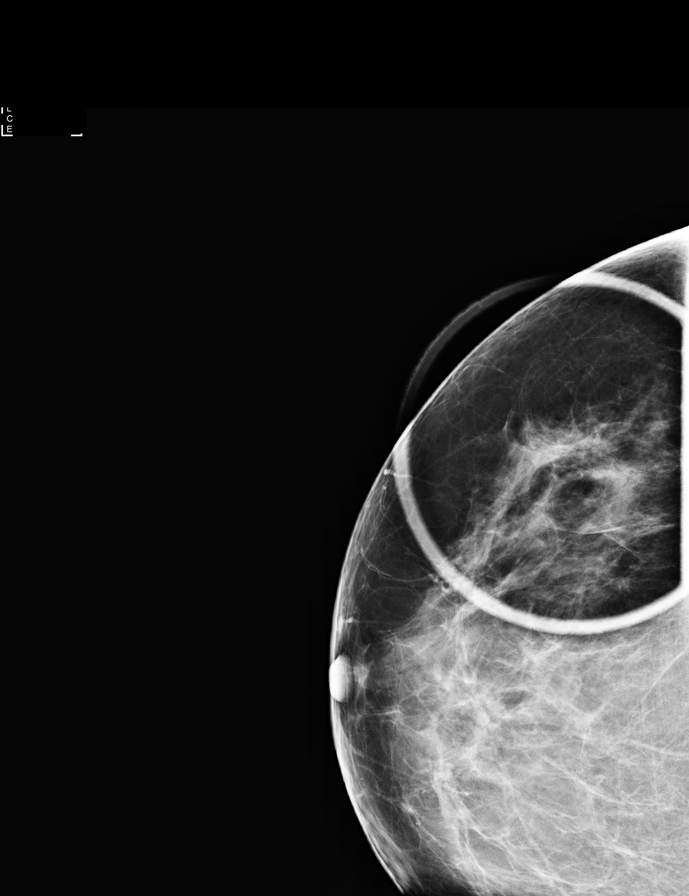

[R MLO synth-2D]
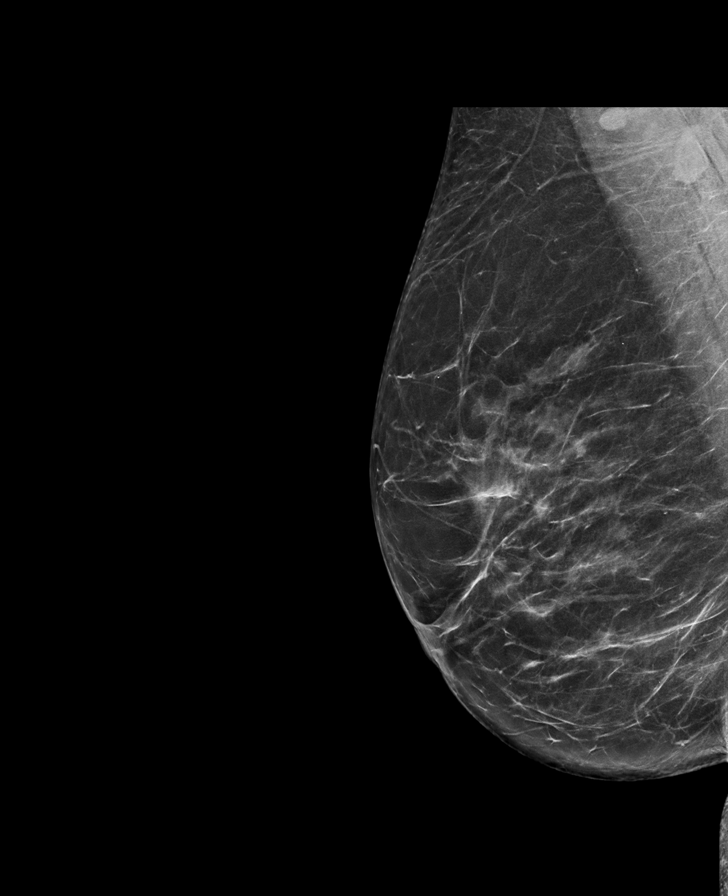

[R CC synth-2D (1 of 2)]
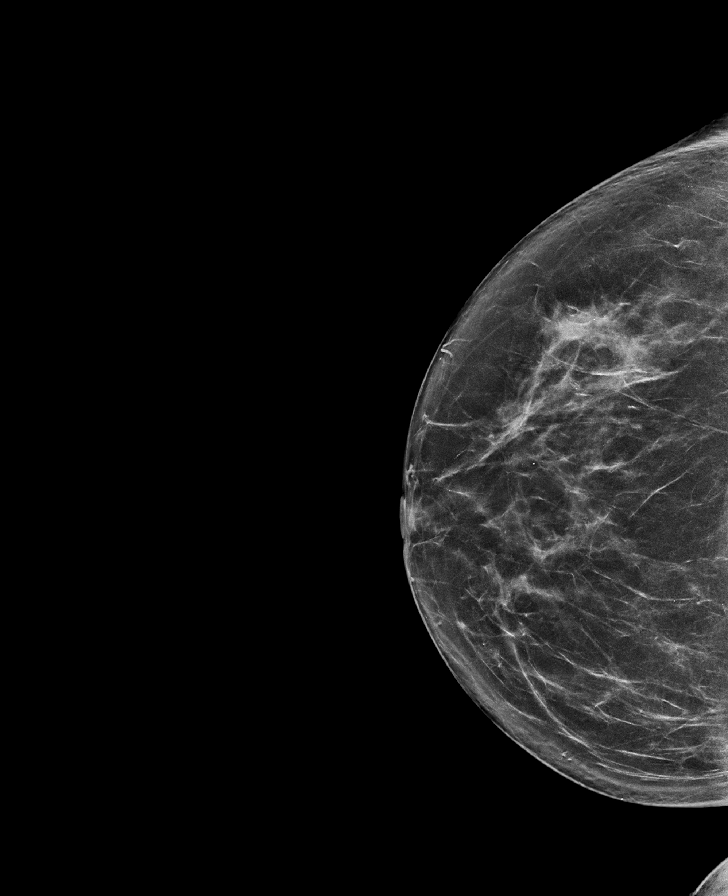

[R ML synth-2D]
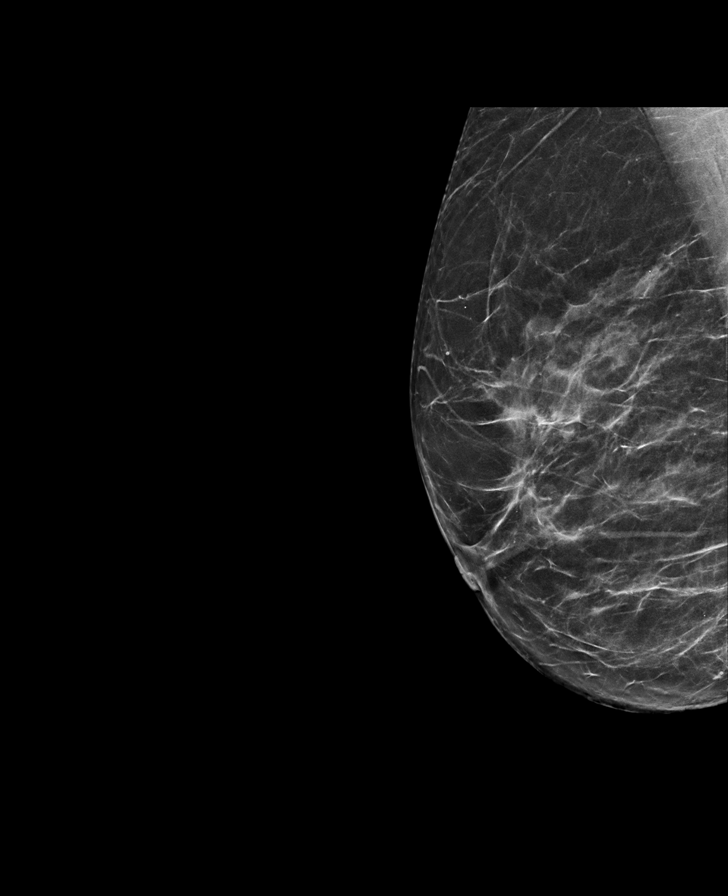

[R CC (2 of 2)]
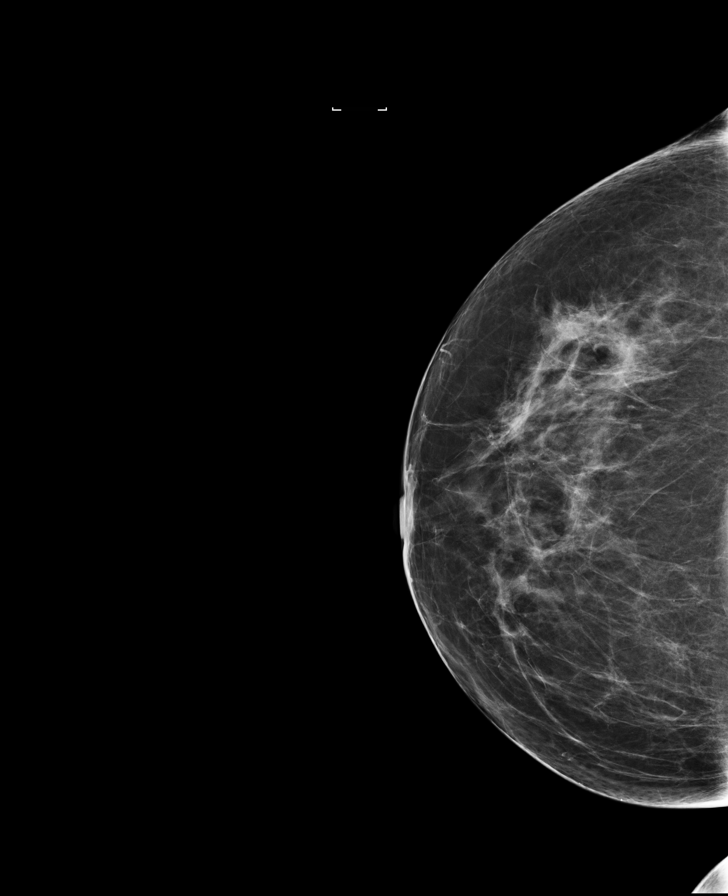

[R ML]
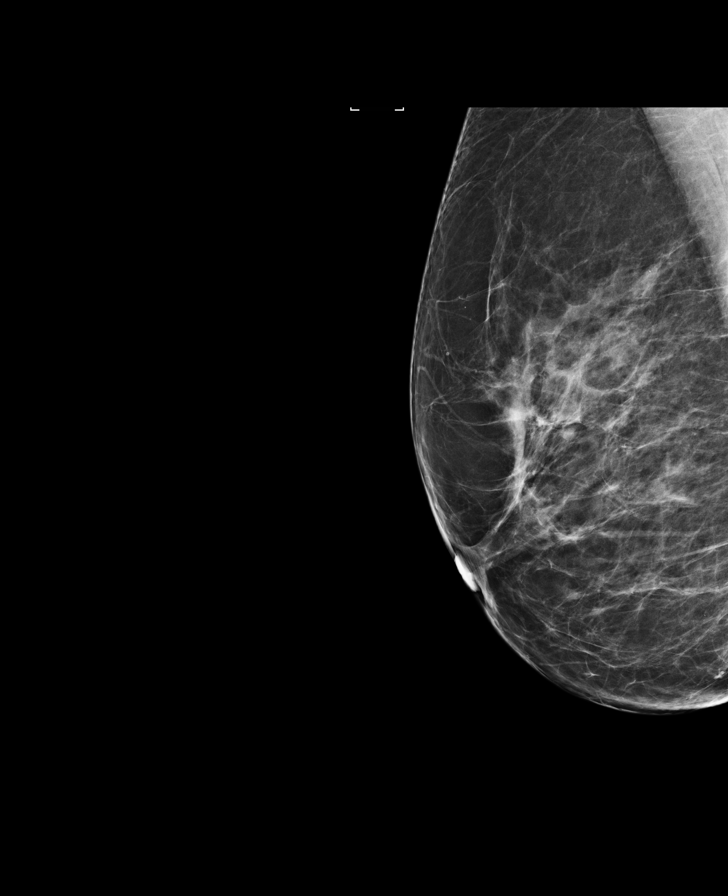

[R MLO]
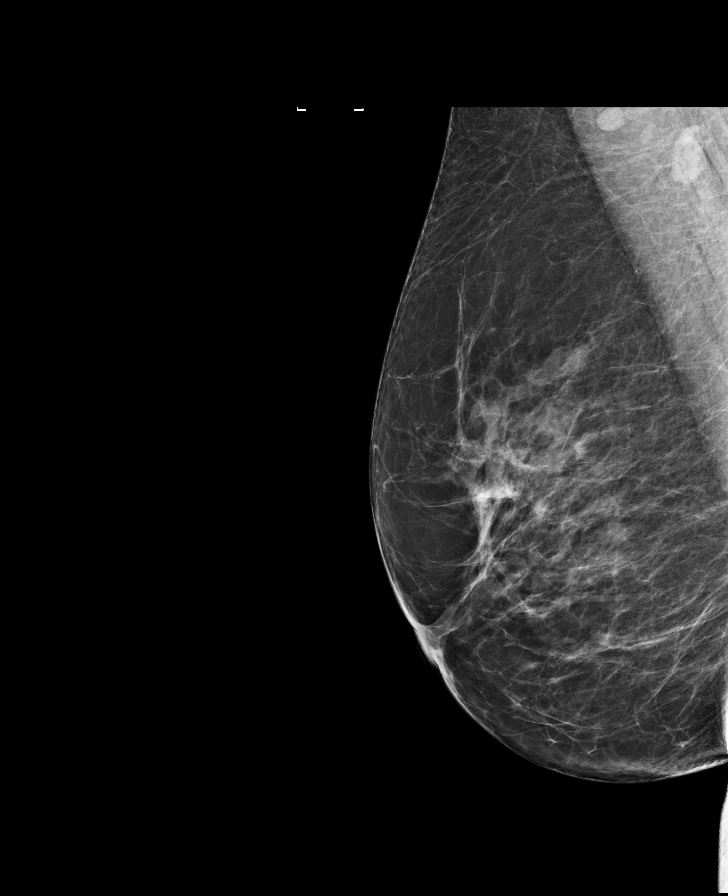

[R CC synth-2D (2 of 2)]
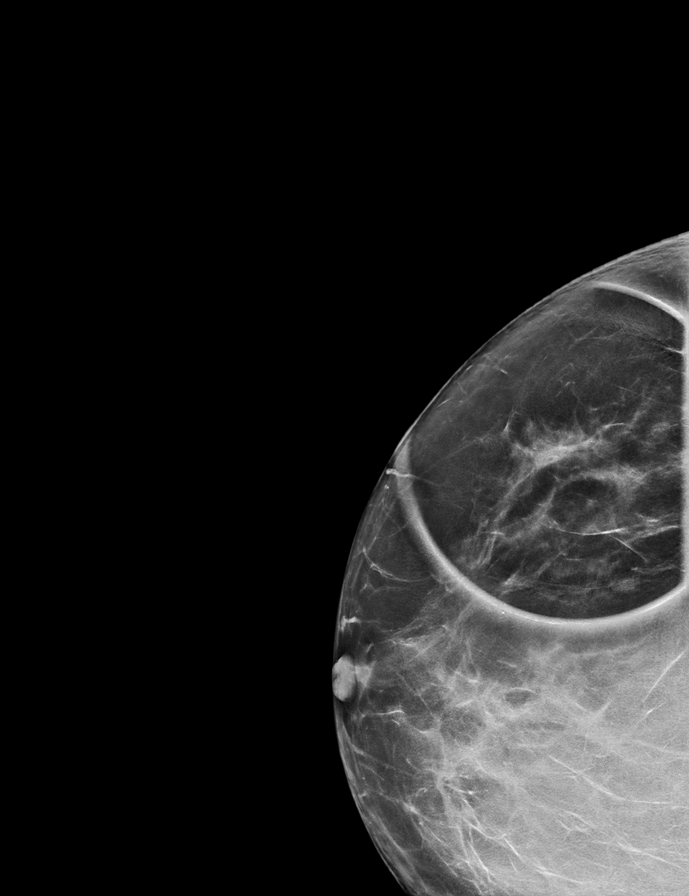

[8 of 28 positions shown; findings below may reference images not displayed]

ACR Breast Density Category c: The breast tissue is heterogeneously
dense, which may obscure small masses.
FINDINGS: The questioned distortion in the right breast resolves with
additional tomosynthesis and spot compression tomosynthesis imaging.
No suspicious calcifications, masses or areas of distortion are seen
in the right breast.

Mammographic images were processed with CAD.
IMPRESSION: Resolution of the area of distortion in the right breast consistent
with overlapping fibroglandular tissue.

RECOMMENDATION:
1.  Screening mammogram in one year.(Code:[id number]-[id number])

2. The patient states that she has had several family members with
history of breast cancer including her mother, as well as other
cancers. Consider genetics counseling/ testing if not already
performed to determine the patient's lifetime risk of breast cancer.
Per American Cancer Society guidelines, if the patient has a
calculated lifetime risk of developing breast cancer of greater than
20%, annual screening MRI of the breasts would be recommended at the
time of screening mammography.

I have discussed the findings and recommendations with the patient.
Results were also provided in writing at the conclusion of the
visit. If applicable, a reminder letter will be sent to the patient
regarding the next appointment.

BI-RADS CATEGORY  1: Negative.

## 2018-06-28 ENCOUNTER — Other Ambulatory Visit: Payer: Self-pay | Admitting: Family Medicine

## 2018-06-28 DIAGNOSIS — Z1231 Encounter for screening mammogram for malignant neoplasm of breast: Secondary | ICD-10-CM

## 2018-08-02 ENCOUNTER — Other Ambulatory Visit: Payer: Self-pay | Admitting: Family Medicine

## 2018-08-02 ENCOUNTER — Ambulatory Visit
Admission: RE | Admit: 2018-08-02 | Discharge: 2018-08-02 | Disposition: A | Discharge status: Home / Self Care | Payer: Medicare Other | Source: Ambulatory Visit | Attending: Family Medicine | Admitting: Family Medicine

## 2018-08-02 DIAGNOSIS — Z1231 Encounter for screening mammogram for malignant neoplasm of breast: Secondary | ICD-10-CM

## 2019-07-27 HISTORY — PX: HIP ARTHROPLASTY: SHX981

## 2019-08-04 ENCOUNTER — Ambulatory Visit
Admission: RE | Admit: 2019-08-04 | Discharge: 2019-08-04 | Disposition: A | Discharge status: Home / Self Care | Payer: Medicare Other | Source: Ambulatory Visit | Attending: Family Medicine | Admitting: Family Medicine

## 2019-08-04 ENCOUNTER — Other Ambulatory Visit: Payer: Self-pay

## 2019-08-04 DIAGNOSIS — Z1231 Encounter for screening mammogram for malignant neoplasm of breast: Secondary | ICD-10-CM

## 2019-09-26 HISTORY — PX: OTHER SURGICAL HISTORY: SHX169

## 2019-12-12 ENCOUNTER — Ambulatory Visit: Payer: Medicare Other | Attending: Internal Medicine

## 2019-12-12 DIAGNOSIS — Z23 Encounter for immunization: Secondary | ICD-10-CM

## 2019-12-12 NOTE — Progress Notes (Signed)
   Covid-19 Vaccination Clinic  Name:  Makayla Marshall    MRN: [id number] DOB: 09-12-1961  12/12/2019  Makayla Marshall was observed post Covid-19 immunization for 15 minutes without incident. She was provided with Vaccine Information Sheet and instruction to access the V-Safe system.   Makayla Marshall was instructed to call 911 with any severe reactions post vaccine: Marland Kitchen Difficulty breathing  . Swelling of face and throat  . A fast heartbeat  . A bad rash all over body  . Dizziness and weakness   Immunizations Administered    Name Date Dose VIS Date Route   Pfizer COVID-19 Vaccine 12/12/2019  1:01 PM 0.3 mL 09/05/2019 Intramuscular   Manufacturer: [place]   Lot: [id number]   [place]: [id number]

## 2020-01-06 ENCOUNTER — Ambulatory Visit: Payer: Medicare Other | Attending: Internal Medicine

## 2020-01-06 DIAGNOSIS — Z23 Encounter for immunization: Secondary | ICD-10-CM

## 2020-01-06 NOTE — Progress Notes (Signed)
   Covid-19 Vaccination Clinic  Name:  Makayla Marshall    MRN: [id number] DOB: [birth year]  01/06/2020  [name] was observed post Covid-19 immunization for 15 minutes without incident. She was provided with Vaccine Information Sheet and instruction to access the V-Safe system.   [name] was instructed to call 911 with any severe reactions post vaccine: Marland Kitchen Difficulty breathing  . Swelling of face and throat  . A fast heartbeat  . A bad rash all over body  . Dizziness and weakness   Immunizations Administered    Name Date Dose VIS Date Route   Pfizer COVID-19 Vaccine 01/06/2020  1:43 PM 0.3 mL 09/05/2019 Intramuscular   Manufacturer: [place]   Lot: [id number]   [place]: [id number]

## 2020-07-26 HISTORY — PX: TOTAL SHOULDER ARTHROPLASTY: SHX126

## 2020-10-05 ENCOUNTER — Ambulatory Visit (HOSPITAL_COMMUNITY)
Admission: RE | Admit: 2020-10-05 | Discharge: 2020-10-05 | Disposition: A | Discharge status: Home / Self Care | Payer: Medicare Other | Source: Ambulatory Visit | Attending: Cardiovascular Disease | Admitting: Cardiovascular Disease

## 2020-10-05 ENCOUNTER — Other Ambulatory Visit: Payer: Self-pay

## 2020-10-05 ENCOUNTER — Other Ambulatory Visit (HOSPITAL_COMMUNITY): Payer: Self-pay | Admitting: Physician Assistant

## 2020-10-05 DIAGNOSIS — M79604 Pain in right leg: Secondary | ICD-10-CM | POA: Diagnosis not present

## 2020-10-05 DIAGNOSIS — M79661 Pain in right lower leg: Secondary | ICD-10-CM | POA: Insufficient documentation

## 2020-10-05 DIAGNOSIS — M79605 Pain in left leg: Secondary | ICD-10-CM

## 2020-10-05 DIAGNOSIS — M79662 Pain in left lower leg: Secondary | ICD-10-CM | POA: Diagnosis present

## 2021-03-31 DIAGNOSIS — Z0000 Encounter for general adult medical examination without abnormal findings: Secondary | ICD-10-CM | POA: Diagnosis not present

## 2021-03-31 DIAGNOSIS — R7301 Impaired fasting glucose: Secondary | ICD-10-CM | POA: Diagnosis not present

## 2021-03-31 DIAGNOSIS — E7800 Pure hypercholesterolemia, unspecified: Secondary | ICD-10-CM | POA: Diagnosis not present

## 2021-03-31 DIAGNOSIS — E559 Vitamin D deficiency, unspecified: Secondary | ICD-10-CM | POA: Diagnosis not present

## 2021-04-13 DIAGNOSIS — M1711 Unilateral primary osteoarthritis, right knee: Secondary | ICD-10-CM | POA: Diagnosis not present

## 2021-04-13 DIAGNOSIS — M170 Bilateral primary osteoarthritis of knee: Secondary | ICD-10-CM | POA: Diagnosis not present

## 2021-05-18 DIAGNOSIS — M1711 Unilateral primary osteoarthritis, right knee: Secondary | ICD-10-CM | POA: Diagnosis not present

## 2021-05-25 DIAGNOSIS — M1711 Unilateral primary osteoarthritis, right knee: Secondary | ICD-10-CM | POA: Diagnosis not present

## 2021-06-01 DIAGNOSIS — M1711 Unilateral primary osteoarthritis, right knee: Secondary | ICD-10-CM | POA: Diagnosis not present

## 2021-07-13 DIAGNOSIS — R569 Unspecified convulsions: Secondary | ICD-10-CM | POA: Diagnosis not present

## 2021-07-13 DIAGNOSIS — R55 Syncope and collapse: Secondary | ICD-10-CM | POA: Diagnosis not present

## 2021-07-13 DIAGNOSIS — M19011 Primary osteoarthritis, right shoulder: Secondary | ICD-10-CM | POA: Diagnosis not present

## 2021-07-13 DIAGNOSIS — I639 Cerebral infarction, unspecified: Secondary | ICD-10-CM | POA: Diagnosis not present

## 2021-07-13 DIAGNOSIS — Z20822 Contact with and (suspected) exposure to covid-19: Secondary | ICD-10-CM | POA: Diagnosis not present

## 2021-07-25 DIAGNOSIS — D229 Melanocytic nevi, unspecified: Secondary | ICD-10-CM | POA: Diagnosis not present

## 2021-07-25 DIAGNOSIS — Z01818 Encounter for other preprocedural examination: Secondary | ICD-10-CM | POA: Diagnosis not present

## 2021-07-25 DIAGNOSIS — R7301 Impaired fasting glucose: Secondary | ICD-10-CM | POA: Diagnosis not present

## 2021-07-26 DIAGNOSIS — Z96641 Presence of right artificial hip joint: Secondary | ICD-10-CM | POA: Diagnosis not present

## 2021-07-26 DIAGNOSIS — M25561 Pain in right knee: Secondary | ICD-10-CM | POA: Diagnosis not present

## 2021-08-05 DIAGNOSIS — I890 Lymphedema, not elsewhere classified: Secondary | ICD-10-CM | POA: Diagnosis not present

## 2021-08-05 DIAGNOSIS — Z4889 Encounter for other specified surgical aftercare: Secondary | ICD-10-CM | POA: Diagnosis not present

## 2021-08-05 DIAGNOSIS — M19011 Primary osteoarthritis, right shoulder: Secondary | ICD-10-CM | POA: Diagnosis not present

## 2021-08-05 DIAGNOSIS — G8918 Other acute postprocedural pain: Secondary | ICD-10-CM | POA: Diagnosis not present

## 2021-08-05 DIAGNOSIS — Z96611 Presence of right artificial shoulder joint: Secondary | ICD-10-CM | POA: Diagnosis not present

## 2021-08-05 DIAGNOSIS — M25511 Pain in right shoulder: Secondary | ICD-10-CM | POA: Diagnosis not present

## 2021-08-15 DIAGNOSIS — Z96611 Presence of right artificial shoulder joint: Secondary | ICD-10-CM | POA: Diagnosis not present

## 2021-08-16 DIAGNOSIS — L821 Other seborrheic keratosis: Secondary | ICD-10-CM | POA: Diagnosis not present

## 2021-08-16 DIAGNOSIS — L218 Other seborrheic dermatitis: Secondary | ICD-10-CM | POA: Diagnosis not present

## 2021-08-16 DIAGNOSIS — Z1283 Encounter for screening for malignant neoplasm of skin: Secondary | ICD-10-CM | POA: Diagnosis not present

## 2021-09-01 DIAGNOSIS — M25511 Pain in right shoulder: Secondary | ICD-10-CM | POA: Diagnosis not present

## 2021-09-01 DIAGNOSIS — M25611 Stiffness of right shoulder, not elsewhere classified: Secondary | ICD-10-CM | POA: Diagnosis not present

## 2021-09-07 DIAGNOSIS — M25511 Pain in right shoulder: Secondary | ICD-10-CM | POA: Diagnosis not present

## 2021-09-07 DIAGNOSIS — M25611 Stiffness of right shoulder, not elsewhere classified: Secondary | ICD-10-CM | POA: Diagnosis not present

## 2021-09-14 DIAGNOSIS — M25611 Stiffness of right shoulder, not elsewhere classified: Secondary | ICD-10-CM | POA: Diagnosis not present

## 2021-09-20 DIAGNOSIS — S63436A Traumatic rupture of volar plate of right little finger at metacarpophalangeal and interphalangeal joint, initial encounter: Secondary | ICD-10-CM | POA: Diagnosis not present

## 2021-09-20 DIAGNOSIS — M79644 Pain in right finger(s): Secondary | ICD-10-CM | POA: Diagnosis not present

## 2021-09-20 DIAGNOSIS — Z96611 Presence of right artificial shoulder joint: Secondary | ICD-10-CM | POA: Diagnosis not present

## 2021-09-21 DIAGNOSIS — M25511 Pain in right shoulder: Secondary | ICD-10-CM | POA: Diagnosis not present

## 2021-09-21 DIAGNOSIS — M25611 Stiffness of right shoulder, not elsewhere classified: Secondary | ICD-10-CM | POA: Diagnosis not present

## 2021-09-22 DIAGNOSIS — M79644 Pain in right finger(s): Secondary | ICD-10-CM | POA: Diagnosis not present

## 2021-09-28 DIAGNOSIS — M25511 Pain in right shoulder: Secondary | ICD-10-CM | POA: Diagnosis not present

## 2021-09-28 DIAGNOSIS — S63436A Traumatic rupture of volar plate of right little finger at metacarpophalangeal and interphalangeal joint, initial encounter: Secondary | ICD-10-CM | POA: Diagnosis not present

## 2021-09-28 DIAGNOSIS — M25611 Stiffness of right shoulder, not elsewhere classified: Secondary | ICD-10-CM | POA: Diagnosis not present

## 2021-09-30 DIAGNOSIS — M25611 Stiffness of right shoulder, not elsewhere classified: Secondary | ICD-10-CM | POA: Diagnosis not present

## 2021-09-30 DIAGNOSIS — M25511 Pain in right shoulder: Secondary | ICD-10-CM | POA: Diagnosis not present

## 2021-10-04 DIAGNOSIS — M25611 Stiffness of right shoulder, not elsewhere classified: Secondary | ICD-10-CM | POA: Diagnosis not present

## 2021-10-04 DIAGNOSIS — M25511 Pain in right shoulder: Secondary | ICD-10-CM | POA: Diagnosis not present

## 2021-10-07 DIAGNOSIS — M25611 Stiffness of right shoulder, not elsewhere classified: Secondary | ICD-10-CM | POA: Diagnosis not present

## 2021-10-07 DIAGNOSIS — M25511 Pain in right shoulder: Secondary | ICD-10-CM | POA: Diagnosis not present

## 2021-10-11 DIAGNOSIS — M25611 Stiffness of right shoulder, not elsewhere classified: Secondary | ICD-10-CM | POA: Diagnosis not present

## 2021-10-11 DIAGNOSIS — M25511 Pain in right shoulder: Secondary | ICD-10-CM | POA: Diagnosis not present

## 2021-10-14 DIAGNOSIS — M25611 Stiffness of right shoulder, not elsewhere classified: Secondary | ICD-10-CM | POA: Diagnosis not present

## 2021-10-14 DIAGNOSIS — M25511 Pain in right shoulder: Secondary | ICD-10-CM | POA: Diagnosis not present

## 2021-10-17 DIAGNOSIS — M25611 Stiffness of right shoulder, not elsewhere classified: Secondary | ICD-10-CM | POA: Diagnosis not present

## 2021-10-17 DIAGNOSIS — M25511 Pain in right shoulder: Secondary | ICD-10-CM | POA: Diagnosis not present

## 2021-10-20 DIAGNOSIS — M25511 Pain in right shoulder: Secondary | ICD-10-CM | POA: Diagnosis not present

## 2021-10-20 DIAGNOSIS — M25611 Stiffness of right shoulder, not elsewhere classified: Secondary | ICD-10-CM | POA: Diagnosis not present

## 2021-10-28 DIAGNOSIS — M25611 Stiffness of right shoulder, not elsewhere classified: Secondary | ICD-10-CM | POA: Diagnosis not present

## 2021-10-28 DIAGNOSIS — M25511 Pain in right shoulder: Secondary | ICD-10-CM | POA: Diagnosis not present

## 2021-11-02 DIAGNOSIS — Z4789 Encounter for other orthopedic aftercare: Secondary | ICD-10-CM | POA: Diagnosis not present

## 2022-02-08 DIAGNOSIS — R051 Acute cough: Secondary | ICD-10-CM | POA: Diagnosis not present

## 2022-02-13 ENCOUNTER — Other Ambulatory Visit: Payer: Self-pay | Admitting: Family Medicine

## 2022-02-13 DIAGNOSIS — Z1231 Encounter for screening mammogram for malignant neoplasm of breast: Secondary | ICD-10-CM

## 2022-02-16 ENCOUNTER — Ambulatory Visit
Admission: RE | Admit: 2022-02-16 | Discharge: 2022-02-16 | Disposition: A | Discharge status: Home / Self Care | Payer: Medicare Other | Source: Ambulatory Visit | Attending: Family Medicine | Admitting: Family Medicine

## 2022-02-16 DIAGNOSIS — Z1231 Encounter for screening mammogram for malignant neoplasm of breast: Secondary | ICD-10-CM

## 2022-02-21 ENCOUNTER — Other Ambulatory Visit: Payer: Self-pay | Admitting: Family Medicine

## 2022-02-21 DIAGNOSIS — R928 Other abnormal and inconclusive findings on diagnostic imaging of breast: Secondary | ICD-10-CM

## 2022-02-27 ENCOUNTER — Other Ambulatory Visit: Payer: Self-pay | Admitting: Family Medicine

## 2022-02-27 ENCOUNTER — Ambulatory Visit
Admission: RE | Admit: 2022-02-27 | Discharge: 2022-02-27 | Disposition: A | Discharge status: Home / Self Care | Payer: Medicare Other | Source: Ambulatory Visit | Attending: Family Medicine | Admitting: Family Medicine

## 2022-02-27 DIAGNOSIS — R922 Inconclusive mammogram: Secondary | ICD-10-CM | POA: Diagnosis not present

## 2022-02-27 DIAGNOSIS — N6012 Diffuse cystic mastopathy of left breast: Secondary | ICD-10-CM | POA: Diagnosis not present

## 2022-02-27 DIAGNOSIS — R928 Other abnormal and inconclusive findings on diagnostic imaging of breast: Secondary | ICD-10-CM

## 2022-02-27 DIAGNOSIS — N6320 Unspecified lump in the left breast, unspecified quadrant: Secondary | ICD-10-CM

## 2022-04-10 DIAGNOSIS — R7301 Impaired fasting glucose: Secondary | ICD-10-CM | POA: Diagnosis not present

## 2022-04-10 DIAGNOSIS — E559 Vitamin D deficiency, unspecified: Secondary | ICD-10-CM | POA: Diagnosis not present

## 2022-04-10 DIAGNOSIS — Z0000 Encounter for general adult medical examination without abnormal findings: Secondary | ICD-10-CM | POA: Diagnosis not present

## 2022-04-10 DIAGNOSIS — E7800 Pure hypercholesterolemia, unspecified: Secondary | ICD-10-CM | POA: Diagnosis not present

## 2022-04-10 DIAGNOSIS — R21 Rash and other nonspecific skin eruption: Secondary | ICD-10-CM | POA: Diagnosis not present

## 2022-05-09 DIAGNOSIS — Z96611 Presence of right artificial shoulder joint: Secondary | ICD-10-CM | POA: Diagnosis not present

## 2022-05-09 DIAGNOSIS — M542 Cervicalgia: Secondary | ICD-10-CM | POA: Diagnosis not present

## 2022-05-10 DIAGNOSIS — M542 Cervicalgia: Secondary | ICD-10-CM | POA: Diagnosis not present

## 2022-05-18 DIAGNOSIS — M542 Cervicalgia: Secondary | ICD-10-CM | POA: Diagnosis not present

## 2022-05-24 DIAGNOSIS — R490 Dysphonia: Secondary | ICD-10-CM | POA: Diagnosis not present

## 2022-05-24 DIAGNOSIS — M542 Cervicalgia: Secondary | ICD-10-CM | POA: Diagnosis not present

## 2022-05-24 DIAGNOSIS — R1314 Dysphagia, pharyngoesophageal phase: Secondary | ICD-10-CM | POA: Diagnosis not present

## 2022-05-25 DIAGNOSIS — G5601 Carpal tunnel syndrome, right upper limb: Secondary | ICD-10-CM | POA: Diagnosis not present

## 2022-05-31 DIAGNOSIS — M5412 Radiculopathy, cervical region: Secondary | ICD-10-CM | POA: Diagnosis not present

## 2022-05-31 DIAGNOSIS — M542 Cervicalgia: Secondary | ICD-10-CM | POA: Diagnosis not present

## 2022-06-05 ENCOUNTER — Ambulatory Visit (HOSPITAL_COMMUNITY): Payer: Self-pay | Admitting: Orthopedic Surgery

## 2022-06-05 DIAGNOSIS — R7303 Prediabetes: Secondary | ICD-10-CM | POA: Diagnosis not present

## 2022-06-05 DIAGNOSIS — R5383 Other fatigue: Secondary | ICD-10-CM | POA: Diagnosis not present

## 2022-06-05 DIAGNOSIS — R7301 Impaired fasting glucose: Secondary | ICD-10-CM | POA: Diagnosis not present

## 2022-06-05 DIAGNOSIS — E7800 Pure hypercholesterolemia, unspecified: Secondary | ICD-10-CM | POA: Diagnosis not present

## 2022-06-05 DIAGNOSIS — E8889 Other specified metabolic disorders: Secondary | ICD-10-CM | POA: Diagnosis not present

## 2022-06-14 NOTE — Pre-Procedure Instructions (Signed)
Surgical Instructions    Your procedure is scheduled on Thursday 06/22/22.   Report to [facility] Main Entrance "A" at 05:30 A.M., then check in with the Admitting office.  Call this number if you have problems the morning of surgery:  [phone]   If you have any questions prior to your surgery date call [phone]: Open Monday-Friday 8am-4pm    Remember:  Do not eat or drink after midnight the night before your surgery     Take these medicines the morning of surgery with A SIP OF WATER:   NONE    STOP TAKING Phentermine two weeks prior to surgery or as soon as possible.   As of today, STOP taking any Aspirin (unless otherwise instructed by your surgeon) Aleve, Naproxen, Ibuprofen, Motrin, Advil, Goody's, BC's, all herbal medications, fish oil, and all vitamins.           Do not wear jewelry or makeup. Do not wear lotions, powders, perfumes/cologne or deodorant. Do not shave 48 hours prior to surgery.  Men may shave face and neck. Do not bring valuables to the hospital. Do not wear nail polish, gel polish, artificial nails, or any other type of covering on natural nails (fingers and toes) If you have artificial nails or gel coating that need to be removed by a nail salon, please have this removed prior to surgery. Artificial nails or gel coating may interfere with anesthesia's ability to adequately monitor your vital signs.  [facility] is not responsible for any belongings or valuables.    Do NOT Smoke (Tobacco/Vaping)  24 hours prior to your procedure  If you use a CPAP at night, you may bring your mask for your overnight stay.   Contacts, glasses, hearing aids, dentures or partials may not be worn into surgery, please bring cases for these belongings   For patients admitted to the hospital, discharge time will be determined by your treatment team.   Patients discharged the day of surgery will not be allowed to drive home, and someone needs to stay with them for 24  hours.   SURGICAL WAITING ROOM VISITATION Patients having surgery or a procedure may have no more than 2 support people in the waiting area - these visitors may rotate.   Children under the age of 62 must have an adult with them who is not the patient. If the patient needs to stay at the hospital during part of their recovery, the visitor guidelines for inpatient rooms apply. Pre-op nurse will coordinate an appropriate time for 1 support person to accompany patient in pre-op.  This support person may not rotate.   Please refer to the [facility] website for the visitor guidelines for Inpatients (after your surgery is over and you are in a regular room).    Special instructions:    Oral Hygiene is also important to reduce your risk of infection.  Remember - BRUSH YOUR TEETH THE MORNING OF SURGERY WITH YOUR REGULAR TOOTHPASTE   [facility]- Preparing For Surgery  Before surgery, you can play an important role. Because skin is not sterile, your skin needs to be as free of germs as possible. You can reduce the number of germs on your skin by washing with CHG (chlorahexidine gluconate) Soap before surgery.  CHG is an antiseptic cleaner which kills germs and bonds with the skin to continue killing germs even after washing.     Please do not use if you have an allergy to CHG or antibacterial soaps. If your  skin becomes reddened/irritated stop using the CHG.  Do not shave (including legs and underarms) for at least 48 hours prior to first CHG shower. It is OK to shave your face.  Please follow these instructions carefully.     Shower the NIGHT BEFORE SURGERY and the MORNING OF SURGERY with CHG Soap.   If you chose to wash your hair, wash your hair first as usual with your normal shampoo. After you shampoo, rinse your hair and body thoroughly to remove the shampoo.  Then [employer] and genitals (private parts) with your normal soap and rinse thoroughly to remove soap.  After that Use CHG Soap as  you would any other liquid soap. You can apply CHG directly to the skin and wash gently with a scrungie or a clean washcloth.   Apply the CHG Soap to your body ONLY FROM THE NECK DOWN.  Do not use on open wounds or open sores. Avoid contact with your eyes, ears, mouth and genitals (private parts). Wash Face and genitals (private parts)  with your normal soap.   Wash thoroughly, paying special attention to the area where your surgery will be performed.  Thoroughly rinse your body with warm water from the neck down.  DO NOT shower/wash with your normal soap after using and rinsing off the CHG Soap.  Pat yourself dry with a CLEAN TOWEL.  Wear CLEAN PAJAMAS to bed the night before surgery  Place CLEAN SHEETS on your bed the night before your surgery  DO NOT SLEEP WITH PETS.   Day of Surgery:  Take a shower with CHG soap. Wear Clean/Comfortable clothing the morning of surgery Do not apply any deodorants/lotions.   Remember to brush your teeth WITH YOUR REGULAR TOOTHPASTE.    If you received a COVID test during your pre-op visit, it is requested that you wear a mask when out in public, stay away from anyone that may not be feeling well, and notify your surgeon if you develop symptoms. If you have been in contact with anyone that has tested positive in the last 10 days, please notify your surgeon.    Please read over the following fact sheets that you were given.

## 2022-06-15 ENCOUNTER — Other Ambulatory Visit: Payer: Self-pay

## 2022-06-15 ENCOUNTER — Encounter (HOSPITAL_COMMUNITY): Payer: Self-pay

## 2022-06-15 ENCOUNTER — Encounter (HOSPITAL_COMMUNITY)
Admission: RE | Admit: 2022-06-15 | Discharge: 2022-06-15 | Disposition: A | Discharge status: Home / Self Care | Payer: Medicare Other | Source: Ambulatory Visit | Attending: Orthopedic Surgery | Admitting: Orthopedic Surgery

## 2022-06-15 VITALS — BP 116/73 | HR 73 | Temp 98.2°F | Resp 16 | Ht 63.5 in | Wt 172.0 lb

## 2022-06-15 DIAGNOSIS — Z01812 Encounter for preprocedural laboratory examination: Secondary | ICD-10-CM | POA: Diagnosis not present

## 2022-06-15 DIAGNOSIS — Z01818 Encounter for other preprocedural examination: Secondary | ICD-10-CM

## 2022-06-15 HISTORY — DX: Other specified postprocedural states: Z98.890

## 2022-06-15 LAB — TYPE AND SCREEN
ABO/RH(D): O POS
Antibody Screen: NEGATIVE

## 2022-06-15 LAB — CBC
HCT: 42.7 % (ref 36.0–46.0)
Hemoglobin: 14.8 g/dL (ref 12.0–15.0)
MCH: 31.8 pg (ref 26.0–34.0)
MCHC: 34.7 g/dL (ref 30.0–36.0)
MCV: 91.8 fL (ref 80.0–100.0)
Platelets: 313 10*3/uL (ref 150–400)
RBC: 4.65 MIL/uL (ref 3.87–5.11)
RDW: 11.7 % (ref 11.5–15.5)
WBC: 6.4 10*3/uL (ref 4.0–10.5)
nRBC: 0 % (ref 0.0–0.2)

## 2022-06-15 LAB — SURGICAL PCR SCREEN
MRSA, PCR: NEGATIVE
Staphylococcus aureus: NEGATIVE

## 2022-06-15 NOTE — Progress Notes (Signed)
PCP - Leighton Ruff MD Cardiologist - denies  PPM/ICD - denies Device Orders -  Rep Notified -   Chest x-ray - na EKG - na Stress Test - na ECHO -none  Cardiac Cath - none  Sleep Study - denies CPAP -   Fasting Blood Sugar -na Checks Blood Sugar _____ times a day  Blood Thinner Instructions:na Aspirin Instructions:na  ERAS Protcol -no PRE-SURGERY Ensure or G2-   COVID TEST- na   Anesthesia review: no  Patient denies shortness of breath, fever, cough and chest pain at PAT appointment   All instructions explained to the patient, with a verbal understanding of the material. Patient agrees to go over the instructions while at home for a better understanding. Patient also instructed to self quarantine after being tested for COVID-19. The opportunity to ask questions was provided.

## 2022-06-16 ENCOUNTER — Other Ambulatory Visit: Payer: Self-pay | Admitting: Orthopedic Surgery

## 2022-06-16 ENCOUNTER — Other Ambulatory Visit (HOSPITAL_COMMUNITY): Payer: Self-pay | Admitting: Orthopedic Surgery

## 2022-06-16 DIAGNOSIS — Z01818 Encounter for other preprocedural examination: Secondary | ICD-10-CM

## 2022-06-16 DIAGNOSIS — M5412 Radiculopathy, cervical region: Secondary | ICD-10-CM | POA: Diagnosis not present

## 2022-06-19 ENCOUNTER — Other Ambulatory Visit (HOSPITAL_COMMUNITY): Payer: Self-pay | Admitting: Orthopedic Surgery

## 2022-06-19 ENCOUNTER — Ambulatory Visit (HOSPITAL_COMMUNITY)
Admission: RE | Admit: 2022-06-19 | Discharge: 2022-06-19 | Disposition: A | Discharge status: Home / Self Care | Payer: Medicare Other | Source: Ambulatory Visit | Attending: Orthopedic Surgery | Admitting: Orthopedic Surgery

## 2022-06-19 DIAGNOSIS — K219 Gastro-esophageal reflux disease without esophagitis: Secondary | ICD-10-CM | POA: Diagnosis not present

## 2022-06-19 DIAGNOSIS — M50021 Cervical disc disorder at C4-C5 level with myelopathy: Secondary | ICD-10-CM | POA: Diagnosis not present

## 2022-06-19 DIAGNOSIS — G8929 Other chronic pain: Secondary | ICD-10-CM | POA: Diagnosis not present

## 2022-06-19 DIAGNOSIS — M50121 Cervical disc disorder at C4-C5 level with radiculopathy: Secondary | ICD-10-CM | POA: Diagnosis not present

## 2022-06-19 DIAGNOSIS — Y838 Other surgical procedures as the cause of abnormal reaction of the patient, or of later complication, without mention of misadventure at the time of the procedure: Secondary | ICD-10-CM | POA: Diagnosis present

## 2022-06-19 DIAGNOSIS — Z01818 Encounter for other preprocedural examination: Secondary | ICD-10-CM | POA: Insufficient documentation

## 2022-06-19 DIAGNOSIS — M4322 Fusion of spine, cervical region: Secondary | ICD-10-CM | POA: Diagnosis not present

## 2022-06-19 DIAGNOSIS — S12600A Unspecified displaced fracture of seventh cervical vertebra, initial encounter for closed fracture: Secondary | ICD-10-CM | POA: Diagnosis not present

## 2022-06-19 DIAGNOSIS — R1310 Dysphagia, unspecified: Secondary | ICD-10-CM | POA: Diagnosis not present

## 2022-06-19 DIAGNOSIS — M5412 Radiculopathy, cervical region: Secondary | ICD-10-CM | POA: Diagnosis present

## 2022-06-19 DIAGNOSIS — S12500A Unspecified displaced fracture of sixth cervical vertebra, initial encounter for closed fracture: Secondary | ICD-10-CM | POA: Diagnosis not present

## 2022-06-19 DIAGNOSIS — G4709 Other insomnia: Secondary | ICD-10-CM | POA: Diagnosis not present

## 2022-06-19 DIAGNOSIS — M960 Pseudarthrosis after fusion or arthrodesis: Secondary | ICD-10-CM | POA: Diagnosis not present

## 2022-06-19 DIAGNOSIS — M4722 Other spondylosis with radiculopathy, cervical region: Secondary | ICD-10-CM | POA: Diagnosis not present

## 2022-06-19 DIAGNOSIS — Z981 Arthrodesis status: Secondary | ICD-10-CM | POA: Diagnosis not present

## 2022-06-19 DIAGNOSIS — Z91018 Allergy to other foods: Secondary | ICD-10-CM | POA: Diagnosis not present

## 2022-06-19 DIAGNOSIS — M4802 Spinal stenosis, cervical region: Secondary | ICD-10-CM | POA: Diagnosis not present

## 2022-06-19 DIAGNOSIS — Z79899 Other long term (current) drug therapy: Secondary | ICD-10-CM | POA: Diagnosis not present

## 2022-06-19 MED ORDER — IOHEXOL 300 MG/ML  SOLN
10.0000 mL | Freq: Once | INTRAMUSCULAR | Status: AC | PRN
  Administered 2022-06-19: 10 mL via ORAL

## 2022-06-21 NOTE — Anesthesia Preprocedure Evaluation (Addendum)
Anesthesia Evaluation  Patient identified by MRN, date of birth, ID band Patient awake    Reviewed: Allergy & Precautions, NPO status , Patient's Chart, lab work & pertinent test results  History of Anesthesia Complications (+) PONV and history of anesthetic complications  Airway Mallampati: II  TM Distance: >3 FB Neck ROM: Limited    Dental  (+) Teeth Intact, Dental Advisory Given   Pulmonary    breath sounds clear to auscultation       Cardiovascular negative cardio ROS   Rhythm:Regular Rate:Normal     Neuro/Psych    GI/Hepatic Neg liver ROS, GERD  ,  Endo/Other  negative endocrine ROS  Renal/GU negative Renal ROS     Musculoskeletal   Abdominal   Peds  Hematology   Anesthesia Other Findings   Reproductive/Obstetrics                           Anesthesia Physical Anesthesia Plan  ASA: 3  Anesthesia Plan: General   Post-op Pain Management:    Induction:   PONV Risk Score and Plan: 4 or greater and Ondansetron, Dexamethasone, Midazolam, Treatment may vary due to age or medical condition and Scopolamine patch - Pre-op  Airway Management Planned: Oral ETT and Video Laryngoscope Planned  Additional Equipment: None  Intra-op Plan:   Post-operative Plan: Possible Post-op intubation/ventilation  Informed Consent: I have reviewed the patients History and Physical, chart, labs and discussed the procedure including the risks, benefits and alternatives for the proposed anesthesia with the patient or authorized representative who has indicated his/her understanding and acceptance.     Dental advisory given  Plan Discussed with: Anesthesiologist and CRNA  Anesthesia Plan Comments:       Anesthesia Quick Evaluation

## 2022-06-22 ENCOUNTER — Inpatient Hospital Stay (HOSPITAL_COMMUNITY): Payer: Medicare Other

## 2022-06-22 ENCOUNTER — Other Ambulatory Visit: Payer: Self-pay

## 2022-06-22 ENCOUNTER — Inpatient Hospital Stay (HOSPITAL_COMMUNITY): Payer: Medicare Other | Admitting: Anesthesiology

## 2022-06-22 ENCOUNTER — Inpatient Hospital Stay (HOSPITAL_COMMUNITY)
Admission: RE | Admit: 2022-06-22 | Discharge: 2022-06-23 | DRG: 473 | Disposition: A | Discharge status: Home / Self Care | Payer: Medicare Other | Attending: Orthopedic Surgery | Admitting: Orthopedic Surgery

## 2022-06-22 ENCOUNTER — Encounter (HOSPITAL_COMMUNITY)
Admission: RE | Disposition: A | Discharge status: Home / Self Care | Payer: Self-pay | Source: Home / Self Care | Attending: Orthopedic Surgery

## 2022-06-22 ENCOUNTER — Encounter (HOSPITAL_COMMUNITY): Payer: Self-pay | Admitting: Orthopedic Surgery

## 2022-06-22 DIAGNOSIS — M50121 Cervical disc disorder at C4-C5 level with radiculopathy: Secondary | ICD-10-CM | POA: Diagnosis present

## 2022-06-22 DIAGNOSIS — Z79899 Other long term (current) drug therapy: Secondary | ICD-10-CM | POA: Diagnosis not present

## 2022-06-22 DIAGNOSIS — M4722 Other spondylosis with radiculopathy, cervical region: Secondary | ICD-10-CM

## 2022-06-22 DIAGNOSIS — G8929 Other chronic pain: Secondary | ICD-10-CM | POA: Diagnosis present

## 2022-06-22 DIAGNOSIS — M5412 Radiculopathy, cervical region: Secondary | ICD-10-CM | POA: Diagnosis present

## 2022-06-22 DIAGNOSIS — M4802 Spinal stenosis, cervical region: Secondary | ICD-10-CM | POA: Diagnosis present

## 2022-06-22 DIAGNOSIS — M4322 Fusion of spine, cervical region: Secondary | ICD-10-CM | POA: Diagnosis not present

## 2022-06-22 DIAGNOSIS — K219 Gastro-esophageal reflux disease without esophagitis: Secondary | ICD-10-CM | POA: Diagnosis present

## 2022-06-22 DIAGNOSIS — Z91018 Allergy to other foods: Secondary | ICD-10-CM | POA: Diagnosis not present

## 2022-06-22 DIAGNOSIS — S12500A Unspecified displaced fracture of sixth cervical vertebra, initial encounter for closed fracture: Secondary | ICD-10-CM | POA: Diagnosis not present

## 2022-06-22 DIAGNOSIS — M960 Pseudarthrosis after fusion or arthrodesis: Principal | ICD-10-CM | POA: Diagnosis present

## 2022-06-22 DIAGNOSIS — Y838 Other surgical procedures as the cause of abnormal reaction of the patient, or of later complication, without mention of misadventure at the time of the procedure: Secondary | ICD-10-CM | POA: Diagnosis present

## 2022-06-22 DIAGNOSIS — R1310 Dysphagia, unspecified: Secondary | ICD-10-CM | POA: Diagnosis present

## 2022-06-22 DIAGNOSIS — S12600A Unspecified displaced fracture of seventh cervical vertebra, initial encounter for closed fracture: Secondary | ICD-10-CM | POA: Diagnosis not present

## 2022-06-22 DIAGNOSIS — Z981 Arthrodesis status: Secondary | ICD-10-CM | POA: Diagnosis not present

## 2022-06-22 HISTORY — PX: ANTERIOR CERVICAL DECOMP/DISCECTOMY FUSION: SHX1161

## 2022-06-22 LAB — ABO/RH: ABO/RH(D): O POS

## 2022-06-22 SURGERY — ANTERIOR CERVICAL DECOMPRESSION/DISCECTOMY FUSION 3 LEVEL/HARDWARE REMOVAL
Anesthesia: General | Site: Spine Cervical

## 2022-06-22 MED ORDER — LACTATED RINGERS IV SOLN: INTRAVENOUS | Status: DC | PRN

## 2022-06-22 MED ORDER — PHENYLEPHRINE 80 MCG/ML (10ML) SYRINGE FOR IV PUSH (FOR BLOOD PRESSURE SUPPORT)
PREFILLED_SYRINGE | INTRAVENOUS | Status: AC
  Filled 2022-06-22: qty 10

## 2022-06-22 MED ORDER — HYDROMORPHONE HCL 1 MG/ML IJ SOLN: 1.0000 mg | INTRAMUSCULAR | Status: DC | PRN

## 2022-06-22 MED ORDER — ONDANSETRON HCL 4 MG/2ML IJ SOLN
INTRAMUSCULAR | Status: DC | PRN
  Administered 2022-06-22: 4 mg via INTRAVENOUS

## 2022-06-22 MED ORDER — FENTANYL CITRATE (PF) 250 MCG/5ML IJ SOLN
INTRAMUSCULAR | Status: AC
  Filled 2022-06-22: qty 5

## 2022-06-22 MED ORDER — CEFAZOLIN SODIUM-DEXTROSE 1-4 GM/50ML-% IV SOLN
1.0000 g | Freq: Three times a day (TID) | INTRAVENOUS | Status: AC
  Administered 2022-06-22 (×2): 1 g via INTRAVENOUS
  Filled 2022-06-22 (×2): qty 50

## 2022-06-22 MED ORDER — METHOCARBAMOL 500 MG PO TABS
500.0000 mg | ORAL_TABLET | Freq: Four times a day (QID) | ORAL | Status: DC | PRN
  Administered 2022-06-22 (×2): 500 mg via ORAL
  Filled 2022-06-22: qty 1

## 2022-06-22 MED ORDER — ACETAMINOPHEN 10 MG/ML IV SOLN
INTRAVENOUS | Status: AC
  Filled 2022-06-22: qty 100

## 2022-06-22 MED ORDER — OXYCODONE-ACETAMINOPHEN 10-325 MG PO TABS: 1.0000 | ORAL_TABLET | Freq: Four times a day (QID) | ORAL | 0 refills | Status: AC | PRN

## 2022-06-22 MED ORDER — SUCCINYLCHOLINE CHLORIDE 200 MG/10ML IV SOSY
PREFILLED_SYRINGE | INTRAVENOUS | Status: AC
  Filled 2022-06-22: qty 10

## 2022-06-22 MED ORDER — PHENYLEPHRINE 80 MCG/ML (10ML) SYRINGE FOR IV PUSH (FOR BLOOD PRESSURE SUPPORT)
PREFILLED_SYRINGE | INTRAVENOUS | Status: DC | PRN
  Administered 2022-06-22 (×2): 160 ug via INTRAVENOUS
  Administered 2022-06-22: 80 ug via INTRAVENOUS

## 2022-06-22 MED ORDER — MIDAZOLAM HCL 2 MG/2ML IJ SOLN
INTRAMUSCULAR | Status: AC
  Filled 2022-06-22: qty 2

## 2022-06-22 MED ORDER — CEFAZOLIN SODIUM-DEXTROSE 2-4 GM/100ML-% IV SOLN
2.0000 g | INTRAVENOUS | Status: AC
  Administered 2022-06-22: 2 g via INTRAVENOUS
  Filled 2022-06-22: qty 100

## 2022-06-22 MED ORDER — FENTANYL CITRATE (PF) 250 MCG/5ML IJ SOLN
INTRAMUSCULAR | Status: DC | PRN
  Administered 2022-06-22 (×5): 50 ug via INTRAVENOUS

## 2022-06-22 MED ORDER — CHLORHEXIDINE GLUCONATE 0.12 % MT SOLN
15.0000 mL | Freq: Once | OROMUCOSAL | Status: AC
  Administered 2022-06-22: 15 mL via OROMUCOSAL
  Filled 2022-06-22: qty 15

## 2022-06-22 MED ORDER — AMISULPRIDE (ANTIEMETIC) 5 MG/2ML IV SOLN
INTRAVENOUS | Status: AC
  Filled 2022-06-22: qty 4

## 2022-06-22 MED ORDER — THROMBIN 20000 UNITS EX SOLR
CUTANEOUS | Status: DC | PRN
  Administered 2022-06-22: 20 mL via TOPICAL

## 2022-06-22 MED ORDER — FLEET ENEMA 7-19 GM/118ML RE ENEM: 1.0000 | ENEMA | Freq: Once | RECTAL | Status: DC | PRN

## 2022-06-22 MED ORDER — ONDANSETRON HCL 4 MG/2ML IJ SOLN
INTRAMUSCULAR | Status: AC
  Filled 2022-06-22: qty 2

## 2022-06-22 MED ORDER — SODIUM CHLORIDE 0.9% FLUSH
3.0000 mL | Freq: Two times a day (BID) | INTRAVENOUS | Status: DC
  Administered 2022-06-22: 3 mL via INTRAVENOUS

## 2022-06-22 MED ORDER — PHENOL 1.4 % MT LIQD: 1.0000 | OROMUCOSAL | Status: DC | PRN

## 2022-06-22 MED ORDER — FENTANYL CITRATE (PF) 100 MCG/2ML IJ SOLN
INTRAMUSCULAR | Status: AC
  Filled 2022-06-22: qty 2

## 2022-06-22 MED ORDER — EPHEDRINE 5 MG/ML INJ
INTRAVENOUS | Status: AC
  Filled 2022-06-22: qty 5

## 2022-06-22 MED ORDER — SURGIFLO WITH THROMBIN (HEMOSTATIC MATRIX KIT) OPTIME
TOPICAL | Status: DC | PRN
  Administered 2022-06-22 (×2): 1 via TOPICAL

## 2022-06-22 MED ORDER — SODIUM CHLORIDE 0.9% FLUSH: 3.0000 mL | INTRAVENOUS | Status: DC | PRN

## 2022-06-22 MED ORDER — THROMBIN 20000 UNITS EX SOLR
CUTANEOUS | Status: AC
  Filled 2022-06-22: qty 20000

## 2022-06-22 MED ORDER — POLYETHYLENE GLYCOL 3350 17 G PO PACK: 17.0000 g | PACK | Freq: Every day | ORAL | Status: DC | PRN

## 2022-06-22 MED ORDER — METHOCARBAMOL 1000 MG/10ML IJ SOLN: 500.0000 mg | Freq: Four times a day (QID) | INTRAVENOUS | Status: DC | PRN

## 2022-06-22 MED ORDER — BUPIVACAINE-EPINEPHRINE (PF) 0.25% -1:200000 IJ SOLN
INTRAMUSCULAR | Status: AC
  Filled 2022-06-22: qty 30

## 2022-06-22 MED ORDER — ACETAMINOPHEN 650 MG RE SUPP: 650.0000 mg | RECTAL | Status: DC | PRN

## 2022-06-22 MED ORDER — OXYCODONE HCL 5 MG PO TABS: 5.0000 mg | ORAL_TABLET | ORAL | Status: DC | PRN

## 2022-06-22 MED ORDER — AMISULPRIDE (ANTIEMETIC) 5 MG/2ML IV SOLN
10.0000 mg | Freq: Once | INTRAVENOUS | Status: AC
  Administered 2022-06-22: 10 mg via INTRAVENOUS

## 2022-06-22 MED ORDER — ORAL CARE MOUTH RINSE: 15.0000 mL | Freq: Once | OROMUCOSAL | Status: AC

## 2022-06-22 MED ORDER — ONDANSETRON HCL 4 MG PO TABS
4.0000 mg | ORAL_TABLET | Freq: Four times a day (QID) | ORAL | Status: DC | PRN
  Administered 2022-06-22: 4 mg via ORAL
  Filled 2022-06-22: qty 1

## 2022-06-22 MED ORDER — DEXAMETHASONE 4 MG PO TABS
4.0000 mg | ORAL_TABLET | Freq: Four times a day (QID) | ORAL | Status: DC
  Administered 2022-06-22 – 2022-06-23 (×4): 4 mg via ORAL
  Filled 2022-06-22 (×4): qty 1

## 2022-06-22 MED ORDER — SUCCINYLCHOLINE CHLORIDE 200 MG/10ML IV SOSY
PREFILLED_SYRINGE | INTRAVENOUS | Status: DC | PRN
  Administered 2022-06-22: 120 mg via INTRAVENOUS

## 2022-06-22 MED ORDER — SODIUM CHLORIDE 0.9 % IV SOLN: INTRAVENOUS | Status: DC

## 2022-06-22 MED ORDER — LACTATED RINGERS IV SOLN: INTRAVENOUS | Status: DC

## 2022-06-22 MED ORDER — ROCURONIUM BROMIDE 10 MG/ML (PF) SYRINGE
PREFILLED_SYRINGE | INTRAVENOUS | Status: DC | PRN
  Administered 2022-06-22: 100 mg via INTRAVENOUS
  Administered 2022-06-22: 30 mg via INTRAVENOUS
  Administered 2022-06-22: 50 mg via INTRAVENOUS

## 2022-06-22 MED ORDER — DEXAMETHASONE SODIUM PHOSPHATE 10 MG/ML IJ SOLN
INTRAMUSCULAR | Status: AC
  Filled 2022-06-22: qty 1

## 2022-06-22 MED ORDER — 0.9 % SODIUM CHLORIDE (POUR BTL) OPTIME
TOPICAL | Status: DC | PRN
  Administered 2022-06-22 (×2): 500 mL
  Administered 2022-06-22: 1000 mL

## 2022-06-22 MED ORDER — FENTANYL CITRATE (PF) 100 MCG/2ML IJ SOLN
25.0000 ug | INTRAMUSCULAR | Status: DC | PRN
  Administered 2022-06-22: 25 ug via INTRAVENOUS
  Administered 2022-06-22: 50 ug via INTRAVENOUS
  Administered 2022-06-22: 25 ug via INTRAVENOUS
  Administered 2022-06-22: 50 ug via INTRAVENOUS

## 2022-06-22 MED ORDER — ACETAMINOPHEN 10 MG/ML IV SOLN
INTRAVENOUS | Status: DC | PRN
  Administered 2022-06-22: 1000 mg via INTRAVENOUS

## 2022-06-22 MED ORDER — TRANEXAMIC ACID-NACL 1000-0.7 MG/100ML-% IV SOLN
1000.0000 mg | INTRAVENOUS | Status: AC
  Administered 2022-06-22: 1000 mg via INTRAVENOUS
  Filled 2022-06-22: qty 100

## 2022-06-22 MED ORDER — LIDOCAINE 2% (20 MG/ML) 5 ML SYRINGE
INTRAMUSCULAR | Status: AC
  Filled 2022-06-22: qty 5

## 2022-06-22 MED ORDER — PHENYLEPHRINE HCL-NACL 20-0.9 MG/250ML-% IV SOLN
INTRAVENOUS | Status: DC | PRN
  Administered 2022-06-22: 50 ug/min via INTRAVENOUS
  Administered 2022-06-22: 25 ug/min via INTRAVENOUS

## 2022-06-22 MED ORDER — MIDAZOLAM HCL 5 MG/5ML IJ SOLN
INTRAMUSCULAR | Status: DC | PRN
  Administered 2022-06-22: 2 mg via INTRAVENOUS

## 2022-06-22 MED ORDER — ROCURONIUM BROMIDE 10 MG/ML (PF) SYRINGE
PREFILLED_SYRINGE | INTRAVENOUS | Status: AC
  Filled 2022-06-22: qty 10

## 2022-06-22 MED ORDER — OXYCODONE HCL 5 MG PO TABS
10.0000 mg | ORAL_TABLET | ORAL | Status: DC | PRN
  Administered 2022-06-22 – 2022-06-23 (×6): 10 mg via ORAL
  Filled 2022-06-22 (×6): qty 2

## 2022-06-22 MED ORDER — METHOCARBAMOL 500 MG PO TABS
ORAL_TABLET | ORAL | Status: AC
  Filled 2022-06-22: qty 1

## 2022-06-22 MED ORDER — EPHEDRINE SULFATE-NACL 50-0.9 MG/10ML-% IV SOSY
PREFILLED_SYRINGE | INTRAVENOUS | Status: DC | PRN
  Administered 2022-06-22: 5 mg via INTRAVENOUS

## 2022-06-22 MED ORDER — ACETAMINOPHEN 325 MG PO TABS: 650.0000 mg | ORAL_TABLET | ORAL | Status: DC | PRN

## 2022-06-22 MED ORDER — LIDOCAINE 2% (20 MG/ML) 5 ML SYRINGE
INTRAMUSCULAR | Status: DC | PRN
  Administered 2022-06-22: 100 mg via INTRAVENOUS

## 2022-06-22 MED ORDER — METHOCARBAMOL 500 MG PO TABS: 500.0000 mg | ORAL_TABLET | Freq: Three times a day (TID) | ORAL | 0 refills | Status: AC | PRN

## 2022-06-22 MED ORDER — BUPIVACAINE-EPINEPHRINE 0.25% -1:200000 IJ SOLN
INTRAMUSCULAR | Status: DC | PRN
  Administered 2022-06-22: 7 mL

## 2022-06-22 MED ORDER — PROPOFOL 10 MG/ML IV BOLUS
INTRAVENOUS | Status: AC
  Filled 2022-06-22: qty 20

## 2022-06-22 MED ORDER — ONDANSETRON HCL 4 MG/2ML IJ SOLN: 4.0000 mg | Freq: Four times a day (QID) | INTRAMUSCULAR | Status: DC | PRN

## 2022-06-22 MED ORDER — SUGAMMADEX SODIUM 200 MG/2ML IV SOLN
INTRAVENOUS | Status: DC | PRN
  Administered 2022-06-22: 200 mg via INTRAVENOUS

## 2022-06-22 MED ORDER — ONDANSETRON HCL 4 MG PO TABS: 4.0000 mg | ORAL_TABLET | Freq: Three times a day (TID) | ORAL | 0 refills | Status: DC | PRN

## 2022-06-22 MED ORDER — DEXAMETHASONE SODIUM PHOSPHATE 4 MG/ML IJ SOLN: 4.0000 mg | Freq: Four times a day (QID) | INTRAMUSCULAR | Status: DC

## 2022-06-22 MED ORDER — MENTHOL 3 MG MT LOZG: 1.0000 | LOZENGE | OROMUCOSAL | Status: DC | PRN

## 2022-06-22 MED ORDER — PROPOFOL 10 MG/ML IV BOLUS
INTRAVENOUS | Status: DC | PRN
  Administered 2022-06-22: 130 mg via INTRAVENOUS
  Administered 2022-06-22: 30 mg via INTRAVENOUS

## 2022-06-22 MED ORDER — DEXAMETHASONE SODIUM PHOSPHATE 10 MG/ML IJ SOLN
INTRAMUSCULAR | Status: DC | PRN
  Administered 2022-06-22: 10 mg via INTRAVENOUS

## 2022-06-22 MED ORDER — HYDROXYZINE HCL 50 MG/ML IM SOLN
50.0000 mg | Freq: Four times a day (QID) | INTRAMUSCULAR | Status: DC | PRN
  Administered 2022-06-22: 50 mg via INTRAMUSCULAR
  Filled 2022-06-22: qty 1

## 2022-06-22 SURGICAL SUPPLY — 73 items
BAG COUNTER SPONGE SURGICOUNT (BAG) ×1 IMPLANT
BAND RUBBER #18 3X1/16 STRL (MISCELLANEOUS) IMPLANT
BIT DRILL ACP 15 (DRILL) IMPLANT
BLADE CLIPPER SURG (BLADE) IMPLANT
BUR EGG ELITE 4.0 (BURR) IMPLANT
BUR MATCHSTICK NEURO 3.0 LAGG (BURR) IMPLANT
CABLE BIPOLOR RESECTION CORD (MISCELLANEOUS) ×1 IMPLANT
CANISTER SUCT 3000ML PPV (MISCELLANEOUS) ×1 IMPLANT
CLSR STERI-STRIP ANTIMIC 1/2X4 (GAUZE/BANDAGES/DRESSINGS) ×1 IMPLANT
CNTNR URN SCR LID CUP LEK RST (MISCELLANEOUS) IMPLANT
CONT SPEC 4OZ STRL OR WHT (MISCELLANEOUS) ×1
COVER MAYO STAND STRL (DRAPES) ×3 IMPLANT
COVER SURGICAL LIGHT HANDLE (MISCELLANEOUS) ×2 IMPLANT
DEVICE ENDSKLTN IMPLANT SM 7MM (Cage) IMPLANT
DRAIN CHANNEL 15F RND FF W/TCR (WOUND CARE) IMPLANT
DRAPE C-ARM 42X72 X-RAY (DRAPES) ×1 IMPLANT
DRAPE MICROSCOPE LEICA 46X105 (MISCELLANEOUS) IMPLANT
DRAPE POUCH INSTRU U-SHP 10X18 (DRAPES) ×1 IMPLANT
DRAPE SURG 17X23 STRL (DRAPES) ×1 IMPLANT
DRAPE U-SHAPE 47X51 STRL (DRAPES) ×1 IMPLANT
DRILL ACP 15 (DRILL) ×1
DRSG OPSITE POSTOP 4X6 (GAUZE/BANDAGES/DRESSINGS) ×1 IMPLANT
DURAPREP 26ML APPLICATOR (WOUND CARE) ×1 IMPLANT
ELECT COATED BLADE 2.86 ST (ELECTRODE) ×1 IMPLANT
ELECT PENCIL ROCKER SW 15FT (MISCELLANEOUS) ×1 IMPLANT
ELECT REM PT RETURN 9FT ADLT (ELECTROSURGICAL) ×1
ELECTRODE REM PT RTRN 9FT ADLT (ELECTROSURGICAL) ×1 IMPLANT
ENDOSKELETON IMPLANT SM 7MM (Cage) ×1 IMPLANT
GLOVE BIO SURGEON STRL SZ 6.5 (GLOVE) ×1 IMPLANT
GLOVE BIOGEL PI IND STRL 6.5 (GLOVE) ×1 IMPLANT
GLOVE BIOGEL PI IND STRL 8.5 (GLOVE) ×1 IMPLANT
GLOVE SS BIOGEL STRL SZ 8.5 (GLOVE) ×1 IMPLANT
GOWN STRL REUS W/ TWL LRG LVL3 (GOWN DISPOSABLE) ×2 IMPLANT
GOWN STRL REUS W/TWL 2XL LVL3 (GOWN DISPOSABLE) ×2 IMPLANT
GOWN STRL REUS W/TWL LRG LVL3 (GOWN DISPOSABLE) ×2
KIT BASIN OR (CUSTOM PROCEDURE TRAY) ×1 IMPLANT
KIT TURNOVER KIT B (KITS) ×1 IMPLANT
NDL SPNL 18GX3.5 QUINCKE PK (NEEDLE) ×1 IMPLANT
NEEDLE HYPO 22GX1.5 SAFETY (NEEDLE) ×1 IMPLANT
NEEDLE SPNL 18GX3.5 QUINCKE PK (NEEDLE) ×1 IMPLANT
NS IRRIG 1000ML POUR BTL (IV SOLUTION) ×1 IMPLANT
PACK ORTHO CERVICAL (CUSTOM PROCEDURE TRAY) ×1 IMPLANT
PACK UNIVERSAL I (CUSTOM PROCEDURE TRAY) ×1 IMPLANT
PAD ARMBOARD 7.5X6 YLW CONV (MISCELLANEOUS) ×3 IMPLANT
PATTIES SURGICAL .25X.25 (GAUZE/BANDAGES/DRESSINGS) ×1 IMPLANT
PATTIES SURGICAL .5 X.5 (GAUZE/BANDAGES/DRESSINGS) IMPLANT
PIN ACP TEMP FIXATION (EXFIX) IMPLANT
PIN DISTRACTION MAXCESS-C 14 (PIN) IMPLANT
PLATE ACP 1.9X58 3LVL (Plate) IMPLANT
POSITIONER HEAD DONUT 9IN (MISCELLANEOUS) ×1 IMPLANT
PUTTY BONE DBX 2.5 MIS (Bone Implant) IMPLANT
RESTRAINT LIMB HOLDER UNIV (RESTRAINTS) ×1 IMPLANT
SCREW ACP 3.5X17 S/D VARIA (Screw) IMPLANT
SCREW ACP VA SD 3.5X15 (Screw) IMPLANT
SCREW ACP VA ST 4X13 (Screw) IMPLANT
SCREW VA ST 4.0X15 (Screw) IMPLANT
SPONGE INTESTINAL PEANUT (DISPOSABLE) ×2 IMPLANT
SPONGE SURGIFOAM ABS GEL 100 (HEMOSTASIS) ×1 IMPLANT
SPONGE T-LAP 4X18 ~~LOC~~+RFID (SPONGE) ×2 IMPLANT
SURGIFLO W/THROMBIN 8M KIT (HEMOSTASIS) IMPLANT
SUT BONE WAX W31G (SUTURE) ×1 IMPLANT
SUT MNCRL AB 3-0 PS2 27 (SUTURE) ×1 IMPLANT
SUT SILK 2 0 (SUTURE) ×1
SUT SILK 2-0 18XBRD TIE 12 (SUTURE) ×1 IMPLANT
SUT VIC AB 2-0 CT1 18 (SUTURE) ×1 IMPLANT
SYR BULB IRRIG 60ML STRL (SYRINGE) ×1 IMPLANT
SYR CONTROL 10ML LL (SYRINGE) ×1 IMPLANT
TAPE CLOTH 4X10 WHT NS (GAUZE/BANDAGES/DRESSINGS) ×1 IMPLANT
TAPE UMBILICAL COTTON 1/8X30 (MISCELLANEOUS) ×1 IMPLANT
TOWEL GREEN STERILE (TOWEL DISPOSABLE) ×1 IMPLANT
TOWEL GREEN STERILE FF (TOWEL DISPOSABLE) ×1 IMPLANT
TRAY FOLEY MTR SLVR 16FR STAT (SET/KITS/TRAYS/PACK) ×1 IMPLANT
WATER STERILE IRR 1000ML POUR (IV SOLUTION) ×1 IMPLANT

## 2022-06-22 NOTE — Transfer of Care (Signed)
Immediate Anesthesia Transfer of Care Note  Patient: Makayla Marshall  Procedure(s) Performed: Removal of hardware, anterior cervical discectomy Cervical four to five, Cervical Fusion Cervical four to seven (Spine Cervical)  Patient Location: PACU  Anesthesia Type:General  Level of Consciousness: oriented, drowsy and patient cooperative  Airway & Oxygen Therapy: Patient Spontanous Breathing and Patient connected to nasal cannula oxygen  Post-op Assessment: Report given to RN and Post -op Vital signs reviewed and stable  Post vital signs: Reviewed  Last Vitals:  Vitals Value Taken Time  BP 111/80 06/22/22 1137  Temp    Pulse 102 06/22/22 1140  Resp 14 06/22/22 1140  SpO2 94 % 06/22/22 1140  Vitals shown include unvalidated device data.  Last Pain:  Vitals:   06/22/22 0608  TempSrc:   PainSc: 0-No pain         Complications: No notable events documented.

## 2022-06-22 NOTE — Anesthesia Postprocedure Evaluation (Signed)
Anesthesia Post Note  Patient: Makayla Marshall  Procedure(s) Performed: Removal of hardware, anterior cervical discectomy Cervical four to five, Cervical Fusion Cervical four to seven (Spine Cervical)     Patient location during evaluation: PACU Anesthesia Type: General Level of consciousness: awake Pain management: pain level controlled Respiratory status: spontaneous breathing Cardiovascular status: stable Postop Assessment: adequate PO intake Anesthetic complications: no   No notable events documented.  Last Vitals:  Vitals:   06/22/22 1151 06/22/22 1206  BP: (!) 117/53 (!) 106/49  Pulse: 98 86  Resp: 15 17  Temp:    SpO2: 95% 94%    Last Pain:  Vitals:   06/22/22 1206  TempSrc:   PainSc: 10-Worst pain ever    LLE Motor Response: Purposeful movement;Responds to commands (06/22/22 1206) LLE Sensation: Full sensation (06/22/22 1206) RLE Motor Response: Purposeful movement;Responds to commands (06/22/22 1206) RLE Sensation: Full sensation (06/22/22 1206)      Raygan Skarda

## 2022-06-22 NOTE — Addendum Note (Signed)
Addendum  created 06/22/22 1300 by Jenne Campus, CRNA   Intraprocedure Meds edited

## 2022-06-22 NOTE — Anesthesia Procedure Notes (Signed)
Procedure Name: Intubation Date/Time: 06/22/2022 7:56 AM  Performed by: Jenne Campus, CRNAPre-anesthesia Checklist: Patient identified, Emergency Drugs available, Suction available and Patient being monitored Patient Re-evaluated:Patient Re-evaluated prior to induction Oxygen Delivery Method: Circle System Utilized Preoxygenation: Pre-oxygenation with 100% oxygen Induction Type: IV induction Ventilation: Mask ventilation without difficulty Laryngoscope Size: Glidescope and 3 Grade View: Grade I Tube type: Oral Tube size: 7.0 mm Number of attempts: 1 Airway Equipment and Method: Stylet and Oral airway Placement Confirmation: ETT inserted through vocal cords under direct vision, positive ETCO2 and breath sounds checked- equal and bilateral Secured at: 22 cm Tube secured with: Tape Dental Injury: Teeth and Oropharynx as per pre-operative assessment

## 2022-06-22 NOTE — Discharge Instructions (Signed)

## 2022-06-22 NOTE — H&P (Signed)
History: Makayla Marshall is a very pleasant active 61 year old with with persistent neck and neuropathic arm pain. Imaging studies demonstrate a prior ACDF C5-7 with a pseudoarthrosis at at C6-7 and ruptured hardware. Patient also has adjacent C4-5 degenerative disc with foraminal stenosis. As result of the failure of conservative management we have elected to move forward with an ACDF at C4-5 and removal of her existing broken hardware and reimplantation of a plate from W7-3 with supplemental bone grafting at C6-7.  Past Medical History:  Diagnosis Date   ADHD (attention deficit hyperactivity disorder)    Arthritis    Chronic neck pain    Complication of anesthesia    for epidural for childbirth 34 yrs ago, nicked spine and got headache when sat up-got blood patch done   GERD (gastroesophageal reflux disease)    not on medication   PONV (postoperative nausea and vomiting)     Allergies  Allergen Reactions   Gluten Meal     redness    No current facility-administered medications on file prior to encounter.   Current Outpatient Medications on File Prior to Encounter  Medication Sig Dispense Refill   diphenhydrAMINE (BENADRYL) 25 MG tablet Take 25 mg by mouth at bedtime.     ibuprofen (ADVIL) 200 MG tablet Take 600 mg by mouth every 6 (six) hours as needed for moderate pain.     ketoconazole (NIZORAL) 2 % cream Apply 1 Application topically at bedtime.     Melatonin 12 MG TABS Take 12 mg by mouth at bedtime.     Multiple Vitamins-Minerals (ADULT GUMMY) CHEW Chew 2 capsules by mouth daily.     phentermine 15 MG capsule Take 15 mg by mouth daily.      Physical Exam: Vitals:   06/22/22 0546  BP: 118/70  Pulse: 69  Resp: 18  Temp: 98 F (36.7 C)  SpO2: 93%   Body mass index is 29.12 kg/m. Clinical exam: Makayla Marshall is a pleasant individual, who appears younger than their stated age.  She is alert and orientated 3.  No shortness of breath, chest pain.  Abdomen is soft and  non-tender, negative loss of bowel and bladder control, no rebound tenderness.  Negative: skin lesions abrasions contusions  Peripheral pulses: 2+ peripheral pulses bilaterally. LE compartments are: Soft and nontender.  Gait pattern: Normal  Assistive devices: None  Neuro: Positive numbness and dysesthesias in the ulnar nerve distribution on the right side. Positive Tinel's at the elbow on the right side. 5/5 motor strength in the upper extremity bilaterally. Negative Hoffman test, negative Lhermitte sign. Symmetrical 1+ deep tendon reflexes in the upper extremity.  Musculoskeletal: well-healed surgical scar from previous ACDF C5-7. Significant neck pain and crepitus with palpation and range of motion. Patient does describe dysphagia especially to solids.  Imaging:  X-rays of the cervical spine demonstrate a ACDF with anterior plate C5-7. The plate has broken at the C6-7 level and there is evidence of a pseudoarthrosis at C6-7. The plate does displace anteriorly. Adjacent segment degenerative disease C4-5  Cervical MRI: completed on 05/14/2022: Prior C5-7 ACDF. Posterior hard disc osteophyte C4-5 causing facet arthropathy and moderate to severe biforaminal stenosis. No significant stenosis C5-6. Posterior osteophyte C6-7 with mild to moderate foraminal stenosis. No cord signal changes.  DG ESOPHAGUS W SINGLE CM (SOL OR THIN BA)  Result Date: 06/19/2022 CLINICAL DATA:  Cervical fusion next week. Evaluate for leak prior to surgery. EXAM: ESOPHOGRAM/BARIUM SWALLOW TECHNIQUE: Combined double contrast and single contrast examination performed using  effervescent crystals, thick barium liquid, and thin barium liquid. FLUOROSCOPY: Radiation Exposure Index (as provided by the fluoroscopic device): 8.4 mGy Kerma COMPARISON:  None Available. FINDINGS: The anterior plate associated with the C5, C6, and C7 vertebral bodies is fractured at the C6-7 level. The plate is deviated anteriorly on the lateral view.  The deviated portion of the cervical plate exerts mass effect on the posterior aspect of the cervical esophagus. Only minimal narrowing is identified in this region. No leak. No penetration into the esophagus is identified. The remainder of the esophagus is unremarkable with no mass or stricture. The GE junction is unremarkable. Limited views of the stomach are within normal limits. Minimal gastroesophageal reflux is identified. IMPRESSION: 1. The anterior plate associated with C5, C6, and C7 is fractured at the C6-7 level. The plate distal to the fracture is deviated anteriorly with mild mass effect on the posterior esophagus at this level. No leak. 2. Mild gastroesophageal reflux. 3. No other significant abnormalities. Electronically Signed   By: Dorise Bullion III M.D.   On: 06/19/2022 10:51    A/P: At this point time based on her clinical exam I do believe she has adjacent segment degenerative disease with C5 neuropathic pain and she has a pseudoarthrosis at C6-7. Given the plate being broken the way it is there is increased risk for potential esophageal injury.    Surgical plan:  1. Removal of existing hardware.  2. Exploration of the pseudoarthrosis at C6-7. Most likely this will require taking down some of the pseudoarthrosis and bone grafting to encourage a fusion.  3. ACDF at C4-5.  4. Application of new cervical plate spanning V4-Q5.    I have gone over the surgical procedure in great detail with the patient including the risks, benefits, and alternatives to surgery. All of her questions were addressed. Risks and benefits of surgery were discussed with the patient. These include: Infection, bleeding, death, stroke, paralysis, ongoing or worse pain, need for additional surgery, nonunion, leak of spinal fluid, adjacent segment degeneration requiring additional fusion surgery. Pseudoarthrosis (nonunion)requiring supplemental posterior fixation. Throat pain, swallowing difficulties, hoarseness or  change in voice.

## 2022-06-22 NOTE — Anesthesia Postprocedure Evaluation (Signed)
Anesthesia Post Note  Patient: [name]  Procedure(s) Performed: Removal of hardware, anterior cervical discectomy Cervical four to five, Cervical Fusion Cervical four to seven (Spine Cervical)     Patient location during evaluation: PACU Anesthesia Type: General Level of consciousness: awake Anesthetic complications: no   No notable events documented.  Last Vitals:  Vitals:   06/22/22 1151 06/22/22 1206  BP: (!) 117/53 (!) 106/49  Pulse: 98 86  Resp: 15 17  Temp:    SpO2: 95% 94%    Last Pain:  Vitals:   06/22/22 1206  TempSrc:   PainSc: 10-Worst pain ever    LLE Motor Response: Purposeful movement;Responds to commands (06/22/22 1206) LLE Sensation: Full sensation (06/22/22 1206) RLE Motor Response: Purposeful movement;Responds to commands (06/22/22 1206) RLE Sensation: Full sensation (06/22/22 1206)      Darris Staiger

## 2022-06-22 NOTE — Op Note (Signed)
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  POST-OPERATIVE DIAGNOSIS: Same  PROCEDURE:   1.  ACDF C4-5. 2.  Removal of broken cervical plate, exploration of the C6-7 fusion 3.  Takedown of pseudoarthrosis at C6-7 with revision bone grafting 4.  Anterior cervical plate fixation L5-7  SURGEON:  Melina Schools, MD  PHYSICIAN ASSISTANT: none   ANESTHESIA:   General  EBL: 262 ml   Complications: None  Implants: Titan intervertebral cage: 7 mm lordotic small.  NuVasive 58 mm ACP cervical plate fixed with 3.5 x 17 mm length locking screws at C4.  3.5 x 15 mm length locking screws C5-7.  Graft: DBX mix  BRIEF HISTORY: Makayla Marshall is a 61 y.o. female who had a anterior cervical discectomy and fusion C5-7 several years ago.  She ultimately developed a pseudoarthrosis and fractured her cervical plate at the M3-5 level.  CT scan showed a partial fusion at C6-7 with a fracture plate with advanced degenerative disc disease C4-5.  Preoperative MRI confirmed foraminal stenosis at C4-5 with nerve compression. as result of her progressive shoulder pain and loss in quality of life and dysphagia we elected to move forward with a revision surgery.  Preoperative barium swallow was done which demonstrated that there was no leak of the esophagus.  In addition preoperative ENT evaluation demonstrated normal vocal cord motion.  All appropriate risks, benefits, and alternatives to surgery were discussed with the patient and consent was obtained.  PROCEDURE DETAILS: Patient was brought into the operating room and was properly positioned on the operating room table.  After induction with general anesthesia the patient was endotracheally  intubated.  A timeout was taken to confirm all important data: including patient, procedure, and the level. Teds, SCD's were applied.   A Foley was placed and the anterior cervical spine was prepped and draped in a standard fashion.  Intraoperative fluoroscopy view taken in order to mark out my incision site over the C5-6 disc space.  I infiltrated the incision site with quarter percent Marcaine with epinephrine and proceeded to perform a modified right transverse Smith-Robinson approach to the cervical spine.  Incision was made and sharp dissection was carried out down to and through the platysma.  I then began dissecting on the medial border of the sternocleidomastoid into the deep cervical fascia.  I swept the esophagus and trachea to the right and continue dissecting sharply through the deep cervical and prevertebral fascia.  I was then able to palpate the carotid sheath and protected with a finger laterally.  Hand-held retractor was then placed to retract the esophagus and using Kitner dissectors I exposed the C4-5 and C5-6 level.  The superior portion of the plate was now visualized.  This allowed me to continue to sweep the esophagus to the right.  Overlying the superior margin of the lower part of the plate was a cystic type structure.  I gently mobilized this along with the esophagus to expose the plate.  With the hardware exposed I then used the removal devices to remove the locking screws.  The plate was then removed.  The screw holes were then sealed with thrombin Gelfoam soaked patties.  With the hardware out I then proceeded to the ACDF at C4-5.  I mobilized the longus coli muscles so I  can expose the entire disc space at C4-5.  Caspar retracting blades were placed underneath the longus coli muscle and the endotracheal cuff was deflated.  I expanded the retractor to the appropriate width and then reinflated the cuff.  Annulotomy was performed at C4-5 after confirming the level with a lateral  fluoroscopic view.  Pituitary rongeurs and curettes I remove the bulk of the disc material.  The overhanging osteophyte from the inferior aspect of C4 was taken down with Kerrison rongeurs.  Distraction pins were then placed into the body of C4 and C5 and I distracted the intervertebral space with a lamina spreader and maintained this with the distraction pin set.  I then continued to use curettes and pituitary rongeurs to remove all of the disc material.  I then used my fine nerve hook to continue to dissect under the posterior annulus and eventually under the posterior longitudinal ligament.  I was able to create a plane between the PLL and the thecal sac I then resected this with a 1 mm Kerrison rongeur.  This then allowed me to undercut the uncovertebral joint to further decompress the nerve and to remove the posterior osteophytes from the body of C4 and C5.  At this point x-rays confirmed that I had adequate posterior decompression.  I was able to pass my nerve hook under the bodies of C4 and C5 and under the uncovertebral joints without meeting resistance.  I then placed the trial intervertebral spacers and elected to use the 7 mm lordotic small implant.  The endplates were then rasped and the wound was irrigated copiously with normal saline.  The intervertebral cage was packed with the allograft and malleted into position.  At this point I then proceeded inferiorly to the C6-7 level.  Using a high-speed bur I took down the superior portion of the pseudoarthrosis until I was able to visualize the solid arthrodesis portion that was posteriorly.  Under live fluoroscopy I attempted to mobilize the C6-7 intervertebral space with a lamina spreader but it was solidly fixed.  I then removed and trim down the overhanging osteophytes and then packed the C6-7 space with DBX mix.  With the pseudoarthrosis now grafted I then contoured a cervical plate and secured it to the vertebral body with a temporary set pin.   Superiorly I placed a 17 mm locking screw.  Screws had excellent purchase.  I then secured the inferior C7 screws and the C5 and C6.  All screws had excellent purchase.  The locking mechanism was engaged and the wound was irrigated copiously with normal saline.  I then checked to ensure the esophagus was not entrapped beneath the plate and all retractors were removed.  After returning trach and esophagus to midline and I closed the platysma with interrupted 2-0 Vicryl suture, and the skin with 3-0 Monocryl.  Steri-Strips and a dry dressing were applied and the patient was ultimately extubated transfer the PACU without incident.  Melina Schools, MD 06/22/2022 11:23 AM

## 2022-06-22 NOTE — Brief Op Note (Signed)
06/22/2022  11:37 AM  PATIENT:  Makayla Marshall  61 y.o. 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Dragon Dictation  PLAN OF CARE: Admit for overnight observation  PATIENT DISPOSITION:  PACU - hemodynamically stable.

## 2022-06-23 ENCOUNTER — Encounter (HOSPITAL_COMMUNITY): Payer: Self-pay | Admitting: Orthopedic Surgery

## 2022-06-23 NOTE — Progress Notes (Signed)
Patient alert and oriented , d/c instruction explain and copy given to the patient, all questions answered.patient d/c per order

## 2022-06-23 NOTE — Discharge Summary (Signed)
Patient ID: Makayla Marshall MRN: [id number] DOB/AGE: 62-Oct-1962 62 y.o.  Admit date: 06/22/2022 Discharge date: 06/23/2022  Admission Diagnoses:  Principal Problem:   Cervical radiculopathy   Discharge Diagnoses:  Principal Problem:   Cervical radiculopathy  status post Procedure(s): Removal of hardware, anterior cervical discectomy Cervical four to five, Cervical Fusion Cervical four to seven  Past Medical History:  Diagnosis Date   ADHD (attention deficit hyperactivity disorder)    Arthritis    Chronic neck pain    Complication of anesthesia    for epidural for childbirth 34 yrs ago, nicked spine and got headache when sat up-got blood patch done   GERD (gastroesophageal reflux disease)    not on medication   PONV (postoperative nausea and vomiting)     Surgeries: Procedure(s): Removal of hardware, anterior cervical discectomy Cervical four to five, Cervical Fusion Cervical four to seven on 06/22/2022   Consultants:   Discharged Condition: Improved  Hospital Course: Makayla Marshall is an 61 y.o. female who was admitted 06/22/2022 for operative treatment of Cervical radiculopathy. Patient failed conservative treatments (please see the history and physical for the specifics) and had severe unremitting pain that affects sleep, daily activities and work/hobbies. After pre-op clearance, the patient was taken to the operating room on 06/22/2022 and underwent  Procedure(s): Removal of hardware, anterior cervical discectomy Cervical four to five, Cervical Fusion Cervical four to seven.    Patient was given perioperative antibiotics:  Anti-infectives (From admission, onward)    Start     Dose/Rate Route Frequency Ordered Stop   06/22/22 1400  ceFAZolin (ANCEF) IVPB 1 g/50 mL premix        1 g 100 mL/hr over 30 Minutes Intravenous Every 8 hours 06/22/22 1310 06/22/22 2137   06/22/22 0542  ceFAZolin (ANCEF) IVPB 2g/100 mL premix        2 g 200 mL/hr over 30 Minutes  Intravenous 30 min pre-op 06/22/22 0542 06/22/22 0846        Patient was given sequential compression devices and early ambulation to prevent DVT.   Patient benefited maximally from hospital stay and there were no complications. At the time of discharge, the patient was urinating/moving their bowels without difficulty, tolerating a regular diet, pain is controlled with oral pain medications and they have been cleared by PT/OT.   Recent vital signs: Patient Vitals for the past 24 hrs:  BP Temp Temp src Pulse Resp SpO2  06/23/22 0341 119/71 97.9 F (36.6 C) Oral 84 18 92 %  06/22/22 2318 120/76 99.1 F (37.3 C) Oral 92 18 94 %  06/22/22 1922 134/80 98.5 F (36.9 C) Oral 95 18 94 %  06/22/22 1640 136/85 98.3 F (36.8 C) Oral (!) 101 18 97 %  06/22/22 1315 129/77 -- -- 86 18 95 %  06/22/22 1251 101/69 98.6 F (37 C) -- 84 17 97 %  06/22/22 1236 110/82 -- -- 89 20 92 %  06/22/22 1221 109/63 -- -- 88 13 95 %  06/22/22 1206 (!) 106/49 -- -- 86 17 94 %  06/22/22 1151 (!) 117/53 -- -- 98 15 95 %  06/22/22 1136 111/80 98.5 F (36.9 C) -- 100 12 93 %     Recent laboratory studies: No results for input(s): "WBC", "HGB", "HCT", "PLT", "NA", "K", "CL", "CO2", "BUN", "CREATININE", "GLUCOSE", "INR", "CALCIUM" in the last 72 hours.  Invalid input(s): "PT", "2"   Discharge Medications:   Allergies as of 06/23/2022       Reactions  Gluten Meal    redness        Medication List     STOP taking these medications    Adult Gummy Chew   ibuprofen 200 MG tablet Commonly known as: ADVIL   ketoconazole 2 % cream Commonly known as: NIZORAL   phentermine 15 MG capsule       TAKE these medications    diphenhydrAMINE 25 MG tablet Commonly known as: BENADRYL Take 25 mg by mouth at bedtime.   Melatonin 12 MG Tabs Take 12 mg by mouth at bedtime.   methocarbamol 500 MG tablet Commonly known as: ROBAXIN Take 1 tablet (500 mg total) by mouth every 8 (eight) hours as needed for up  to 5 days for muscle spasms.   ondansetron 4 MG tablet Commonly known as: Zofran Take 1 tablet (4 mg total) by mouth every 8 (eight) hours as needed for nausea or vomiting.   oxyCODONE-acetaminophen 10-325 MG tablet Commonly known as: Percocet Take 1 tablet by mouth every 6 (six) hours as needed for up to 5 days for pain.        Diagnostic Studies: DG Cervical Spine 2 or 3 views  Result Date: 06/22/2022 CLINICAL DATA:  Status post C4 through C7 ACDF EXAM: CERVICAL SPINE - 2-3 VIEW COMPARISON:  None Available. FINDINGS: Five images obtained via portable C-arm radiography in the operating room were submitted. Liver stem itch there is a surgical probe within the anterior aspect of the C4-5 disc space. On the second image there is been placement of anterior sideplate and screw device at the C4 through C7. Interbody spacer has been placed within the C4-5 disc space. Alignment appears anatomic. IMPRESSION: Status post ACDF of C4 through C7. Electronically Signed   By: Kerby Moors M.D.   On: 06/22/2022 11:43   DG C-Arm 1-60 Min-No Report  Result Date: 06/22/2022 Fluoroscopy was utilized by the requesting physician.  No radiographic interpretation.   DG C-Arm 1-60 Min-No Report  Result Date: 06/22/2022 Fluoroscopy was utilized by the requesting physician.  No radiographic interpretation.   DG C-Arm 1-60 Min-No Report  Result Date: 06/22/2022 Fluoroscopy was utilized by the requesting physician.  No radiographic interpretation.   DG C-Arm 1-60 Min-No Report  Result Date: 06/22/2022 Fluoroscopy was utilized by the requesting physician.  No radiographic interpretation.   DG ESOPHAGUS W SINGLE CM (SOL OR THIN BA)  Result Date: 06/19/2022 CLINICAL DATA:  Cervical fusion next week. Evaluate for leak prior to surgery. EXAM: ESOPHOGRAM/BARIUM SWALLOW TECHNIQUE: Combined double contrast and single contrast examination performed using effervescent crystals, thick barium liquid, and thin barium  liquid. FLUOROSCOPY: Radiation Exposure Index (as provided by the fluoroscopic device): 8.4 mGy Kerma COMPARISON:  None Available. FINDINGS: The anterior plate associated with the C5, C6, and C7 vertebral bodies is fractured at the C6-7 level. The plate is deviated anteriorly on the lateral view. The deviated portion of the cervical plate exerts mass effect on the posterior aspect of the cervical esophagus. Only minimal narrowing is identified in this region. No leak. No penetration into the esophagus is identified. The remainder of the esophagus is unremarkable with no mass or stricture. The GE junction is unremarkable. Limited views of the stomach are within normal limits. Minimal gastroesophageal reflux is identified. IMPRESSION: 1. The anterior plate associated with C5, C6, and C7 is fractured at the C6-7 level. The plate distal to the fracture is deviated anteriorly with mild mass effect on the posterior esophagus at this level. No leak. 2. Mild gastroesophageal  reflux. 3. No other significant abnormalities. Electronically Signed   By: Dorise Bullion III M.D.   On: 06/19/2022 10:51    Discharge Instructions     Incentive spirometry RT   Complete by: As directed         Follow-up Information     Melina Schools, MD Follow up in 2 week(s).   Specialty: Orthopedic Surgery Why: For suture removal, If symptoms worsen, For wound re-check Contact information: [address] [place] [address] [phone]                 Discharge Plan:  discharge to home  Disposition: At the time of discharge patient was doing exceptionally well.  There was no swelling, or difficulty breathing.  Dressings were clean dry and intact.  Patient was tolerating a regular diet, ambulating without assistance, and voiding spontaneously.  Patient will be discharged to home this morning with appropriate instructions and medications.  All of her questions and concerns were addressed.  Patient  will follow-up with me in 2 weeks for wound check and reevaluation.  If there is any issues questions or problems she knows to contact me and I will address them at that time.    Signed: Dahlia Bailiff for Dr. Melina Schools [facility] [phone] 06/23/2022, 6:47 AM

## 2022-06-23 NOTE — Plan of Care (Signed)

## 2022-06-23 NOTE — Evaluation (Signed)
Occupational Therapy Evaluation Patient Details Name: Makayla Marshall MRN: [id number] DOB: 01/11/1961 Today's Date: 06/23/2022   History of Present Illness 61 yo female s/p ACDF C5-7 on 9/28. PMH including ADHD and arthritis.   Clinical Impression   PTA, pt was living with her husband and was independent. Currently, pt performing Mod I - Independent for ADLs and functional mobility. Provided education and handout on cervical precautions, brace management, grooming, UB ADLs, LB ADLs, toileting, and shower transfer; pt demonstrated understanding. Answered all pt questions. Recommend dc home once medically stable per physician. All acute OT needs met and will sign off. Thank you.    Recommendations for follow up therapy are one component of a multi-disciplinary discharge planning process, led by the attending physician.  Recommendations may be updated based on patient status, additional functional criteria and insurance authorization.   Follow Up Recommendations  No OT follow up    Assistance Recommended at Discharge PRN  Patient can return home with the following      Functional Status Assessment  Patient has had a recent decline in their functional status and demonstrates the ability to make significant improvements in function in a reasonable and predictable amount of time.  Equipment Recommendations  None recommended by OT    Recommendations for Other Services       Precautions / Restrictions Precautions Precautions: Cervical Precaution Booklet Issued: Yes (comment) Required Braces or Orthoses: Cervical Brace Restrictions Weight Bearing Restrictions: No      Mobility Bed Mobility Overal bed mobility: Independent             General bed mobility comments: log roll    Transfers Overall transfer level: Independent                        Balance Overall balance assessment: No apparent balance deficits (not formally assessed)                                          ADL either performed or assessed with clinical judgement   ADL Overall ADL's : Modified independent                                             Vision         Perception     Praxis      Pertinent Vitals/Pain Pain Assessment Pain Assessment: Faces Faces Pain Scale: Hurts little more Pain Location: neck Pain Descriptors / Indicators: Discomfort Pain Intervention(s): Monitored during session, Repositioned     Hand Dominance     Extremity/Trunk Assessment Upper Extremity Assessment Upper Extremity Assessment: Overall WFL for tasks assessed   Lower Extremity Assessment Lower Extremity Assessment: Overall WFL for tasks assessed   Cervical / Trunk Assessment Cervical / Trunk Assessment: Neck Surgery   Communication Communication Communication: No difficulties   Cognition Arousal/Alertness: Awake/alert Behavior During Therapy: WFL for tasks assessed/performed Overall Cognitive Status: Within Functional Limits for tasks assessed                                       General Comments  husband present    Exercises     Shoulder Instructions  Home Living Family/patient expects to be discharged to:: Private residence Living Arrangements: Spouse/significant other Available Help at Discharge: Family Type of Home: House Home Access: Level entry     [place]: Two level;Able to live on main level with bedroom/bathroom     Bathroom Shower/Tub: Occupational psychologist: Standard (comfort)     Home Equipment: Shower seat          Prior Functioning/Environment Prior Level of Function : Independent/Modified Independent                        OT Problem List: Decreased activity tolerance;Decreased knowledge of use of DME or AE;Decreased knowledge of precautions      OT Treatment/Interventions:      OT Goals(Current goals can be found in the care plan section) Acute  Rehab OT Goals Patient Stated Goal: Go home OT Goal Formulation: All assessment and education complete, DC therapy  OT Frequency:      Co-evaluation              AM-PAC OT "6 Clicks" Daily Activity     Outcome Measure Help from another person eating meals?: None Help from another person taking care of personal grooming?: None Help from another person toileting, which includes using toliet, bedpan, or urinal?: None Help from another person bathing (including washing, rinsing, drying)?: None Help from another person to put on and taking off regular upper body clothing?: None Help from another person to put on and taking off regular lower body clothing?: None 6 Click Score: 24   End of Session Equipment Utilized During Treatment: Cervical collar Nurse Communication: Mobility status  Activity Tolerance: Patient tolerated treatment well Patient left: in chair;with call bell/phone within reach  OT Visit Diagnosis: Unsteadiness on feet (R26.81);Other abnormalities of gait and mobility (R26.89);Muscle weakness (generalized) (M62.81)                Time: 7076-1518 OT Time Calculation (min): 10 min Charges:  OT General Charges $OT Visit: 1 Visit OT Evaluation $OT Eval Low Complexity: 1 Low  Tannar Broker MSOT, OTR/L Acute Rehab Office: [place] 06/23/2022, 8:38 AM

## 2022-07-05 DIAGNOSIS — M542 Cervicalgia: Secondary | ICD-10-CM | POA: Diagnosis not present

## 2022-07-06 ENCOUNTER — Encounter: Payer: Self-pay | Admitting: Allergy & Immunology

## 2022-07-06 ENCOUNTER — Ambulatory Visit: Payer: Medicare Other | Admitting: Allergy & Immunology

## 2022-07-06 VITALS — BP 118/66 | HR 101 | Temp 97.6°F | Resp 18 | Ht 63.5 in | Wt 164.2 lb

## 2022-07-06 DIAGNOSIS — H04203 Unspecified epiphora, bilateral lacrimal glands: Secondary | ICD-10-CM

## 2022-07-06 DIAGNOSIS — R21 Rash and other nonspecific skin eruption: Secondary | ICD-10-CM | POA: Diagnosis not present

## 2022-07-06 DIAGNOSIS — Z560 Unemployment, unspecified: Secondary | ICD-10-CM

## 2022-07-06 NOTE — Patient Instructions (Addendum)
1. Rash - I am not sure what is going on with the rash.  - I want some pictures the next time that it happens. - We are going to get some labs to look at weird causes of rashes. - We are also going to look into allergies to gums, which are used in food preservatives.  - We will call you in 1-2 weeks with the results of the testing. - This might be contact dermatitis, which is an allergic reaction to detergents/chemicals/etc.  - This would need patch testing to look into this further.  - Please email pictures to [email or web address] so that we can put them into your chart.   2. Return in about 3 months (around 10/06/2022).    Please inform us of any Emergency Department visits, hospitalizations, or changes in symptoms. Call us before going to the ED for breathing or allergy symptoms since we might be able to fit you in for a sick visit. Feel free to contact us anytime with any questions, problems, or concerns.  It was a pleasure to meet you today!  Websites that have reliable patient information: 1. American Academy of Asthma, Allergy, and Immunology: www.aaaai.org 2. Food Allergy Research and Education (FARE): foodallergy.org 3. Mothers of Asthmatics: http://www.asthmacommunitynetwork.org 4. American College of Allergy, Asthma, and Immunology: www.acaai.org   COVID-19 Vaccine Information can be found at: ShippingScam.[email or web address].uk For questions related to vaccine distribution or appointments, please email [email or web address] or call [phone].   We realize that you might be concerned about having an allergic reaction to the COVID19 vaccines. To help with that concern, WE ARE OFFERING THE COVID19 VACCINES IN OUR OFFICE! Ask the front desk for dates!     "Like" [place] on Facebook and Instagram for our latest updates!      A healthy democracy works best when [employer] participate! Make sure you are registered to vote! If  you have moved or changed any of your contact information, you will need to get this updated before voting!  In some cases, you MAY be able to register to vote online: [email or web address]

## 2022-07-06 NOTE — Progress Notes (Signed)
NEW PATIENT  Date of Service/Encounter:  07/06/22  Consult requested by: Marda Stalker, PA-C   Assessment:   Rash  Watery eyes - getting environmental allergy testing via the blood  Disabled status  Plan/Recommendations:   1. Rash - I am not sure what is going on with the rash.  - I want some pictures the next time that it happens. - We are going to get some labs to look at weird causes of rashes. - We are also going to look into allergies to gums, which are used in food preservatives.  - We will call you in 1-2 weeks with the results of the testing. - This might be contact dermatitis, which is an allergic reaction to detergents/chemicals/etc.  - This would need patch testing to look into this further.  - Please email pictures to [email or web address] so that we can put them into your chart.   2. Return in about 3 months (around 10/06/2022).    This note in its entirety was forwarded to the Provider who requested this consultation.  Subjective:   Makayla Marshall is a 61 y.o. female presenting today for evaluation of  Chief Complaint  Patient presents with   Advice Only    Cosult    Urticaria    Makayla Marshall has a history of the following: Patient Active Problem List   Diagnosis Date Noted   Cervical radiculopathy 06/22/2022   OA (osteoarthritis) of hip 09/07/2014    History obtained from: chart review and patient.  Makayla Marshall was referred by Marda Stalker, PA-C.     Makayla Marshall) is a 61 y.o. female presenting for an evaluation of urticaria . She reports that she gets rashes throughout her head and her face and cheeks. She has them on her eyelids. She will be unable to open and lcose her eyes. This has been going on for 6-7 years. It has spread to her mouth and eyelids which is why she ended up going to seek help. She has not needed epinephrine. It has never progressed to throat involvement. It does not leave  scars.   She started eating "all clean". This has helped some. She cut out gluten. This has helped a lot as well. It is not as red as it was previously. If she eats gluten, she will develop redness over her entire face. She does not have pictures of when it looks like when it is particularly bad. Her dad and sister have the same rash issue. She is unsure of the time course.   She has a history of neck surgery. She has an Designer, multimedia in place today from a recent spine surgery. She has had five surgeries in two years. 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    Allergic Rhinitis Symptom History: She has watery eyes constantly. She did have  runny nose occasionally. She really is rather healthy.  Food Allergy Symptom History: She had testing done when she was 61 years of age or so. She had a history of a croupy cough. She was never diagnosed with asthma.   She grew up in [place], starting off in [place].   Otherwise, there is no history of other atopic diseases, including asthma, food allergies, drug allergies, stinging insect allergies, or contact dermatitis. There is no significant infectious history. Vaccinations are up to date.    Past Medical History: Patient Active Problem List   Diagnosis Date Noted   Cervical radiculopathy 06/22/2022  OA (osteoarthritis) of hip 09/07/2014    Medication List:  Allergies as of 07/06/2022       Reactions   Gluten Meal    redness        Medication List        Accurate as of July 06, 2022  5:06 PM. If you have any questions, ask your nurse or doctor.          STOP taking these medications    ondansetron 4 MG tablet Commonly known as: Zofran Stopped by: Valentina Shaggy, MD       TAKE these medications    diphenhydrAMINE 25 MG tablet Commonly known as: BENADRYL Take 25 mg by mouth at bedtime.   eszopiclone 1 MG Tabs tablet Commonly known as: LUNESTA Take 1 mg by mouth at bedtime.   Melatonin  12 MG Tabs Take 12 mg by mouth at bedtime.   phentermine 15 MG capsule Take 1 capsule by mouth daily.        Birth History: non-contributory  Developmental History: non-contributory  Past Surgical History: Past Surgical History:  Procedure Laterality Date   ANTERIOR CERVICAL DECOMP/DISCECTOMY FUSION N/A 06/22/2022   Procedure: Removal of hardware, anterior cervical discectomy Cervical four to five, Cervical Fusion Cervical four to seven;  Surgeon: Melina Schools, MD;  Location: [facility];  Service: Orthopedics;  Laterality: N/A;  4 hrs 3 C-Bed   CERVICAL SPINE SURGERY     FEMUR FRACTURE SURGERY     HIP ARTHROPLASTY Right 07/2019   meniscus Right 09/2019   torn meniscus repair   TONSILLECTOMY     age 60   TOTAL HIP ARTHROPLASTY Left 09/07/2014   Procedure: LEFT TOTAL HIP ARTHROPLASTY ANTERIOR APPROACH;  Surgeon: Gearlean Alf, MD;  Location: [facility];  Service: Orthopedics;  Laterality: Left;   TOTAL SHOULDER ARTHROPLASTY Right 07/2020   TUBAL LIGATION       Family History: Family History  Problem Relation Age of Onset   Thyroid cancer Mother    Lung cancer Mother        developed from the radation for thyroid cancer/deceased   Breast cancer Maternal Aunt        older aunt-diag at age-around 81, triple negative   Pancreatic cancer Maternal Aunt        younger aunt   Seizures Maternal Uncle    Parkinson's disease Paternal Uncle        and dementia     Social History: Makayla Marshall lives at home with her family.  She lives in a house that was remodeled in 2017.  There are hardwood floors throughout the home.  There is gas heating and central cooling.  There is 1 buried usual in the home.  There are dust mite covers on the bedding.  There is no tobacco exposure.  She is currently retired.  She is not exposed to fumes, chemicals, or dust.  She does not have a HEPA filter in her home.  She does not live near an interstate or industrial area.   Review of Systems  Constitutional:  Negative.  Negative for chills, fever, malaise/fatigue and weight loss.  HENT: Negative.  Negative for congestion, ear discharge, ear pain and sinus pain.   Eyes:  Negative for pain, discharge and redness.  Respiratory:  Negative for cough, sputum production, shortness of breath and wheezing.   Cardiovascular: Negative.  Negative for chest pain and palpitations.  Gastrointestinal:  Negative for abdominal pain, blood in stool, constipation, diarrhea, heartburn, nausea and vomiting.  Skin:  Positive for itching and rash.  Neurological:  Negative for dizziness and headaches.  Endo/Heme/Allergies:  Negative for environmental allergies. Does not bruise/bleed easily.       Objective:   Blood pressure 118/66, pulse (!) 101, temperature 97.6 F (36.4 C), resp. rate 18, height 5' 3.5" (1.613 m), weight 164 lb 3.2 oz (74.5 kg), SpO2 98 %. Body mass index is 28.63 kg/m.     Physical Exam Vitals reviewed.  Constitutional:      Appearance: She is well-developed.     Comments: Pleasant and talkative.   HENT:     Head: Normocephalic and atraumatic.     Right Ear: Tympanic membrane, ear canal and external ear normal. No drainage, swelling or tenderness. Tympanic membrane is not injected, scarred, erythematous, retracted or bulging.     Left Ear: Tympanic membrane, ear canal and external ear normal. No drainage, swelling or tenderness. Tympanic membrane is not injected, scarred, erythematous, retracted or bulging.     Nose: No nasal deformity, septal deviation, mucosal edema or rhinorrhea.     Right Sinus: No maxillary sinus tenderness or frontal sinus tenderness.     Left Sinus: No maxillary sinus tenderness or frontal sinus tenderness.     Mouth/Throat:     Mouth: Mucous membranes are not pale and not dry.     Pharynx: Uvula midline.  Eyes:     General:        Right eye: No discharge.        Left eye: No discharge.     Conjunctiva/sclera: Conjunctivae normal.     Right eye: Right  conjunctiva is not injected. No chemosis.    Left eye: Left conjunctiva is not injected. No chemosis.    Pupils: Pupils are equal, round, and reactive to light.  Cardiovascular:     Rate and Rhythm: Normal rate and regular rhythm.     Heart sounds: Normal heart sounds.  Pulmonary:     Effort: Pulmonary effort is normal. No tachypnea, accessory muscle usage or respiratory distress.     Breath sounds: Normal breath sounds. No wheezing, rhonchi or rales.  Chest:     Chest wall: No tenderness.  Abdominal:     Tenderness: There is no abdominal tenderness. There is no guarding or rebound.  Lymphadenopathy:     Head:     Right side of head: No submandibular, tonsillar or occipital adenopathy.     Left side of head: No submandibular, tonsillar or occipital adenopathy.     Cervical: No cervical adenopathy.  Skin:    General: Skin is warm.     Capillary Refill: Capillary refill takes less than 2 seconds.     Coloration: Skin is not pale.     Findings: No abrasion, erythema, petechiae or rash. Rash is not papular, urticarial or vesicular.     Comments: No eczematous or urticarial lesions noted.   Neurological:     Mental Status: She is alert.  Psychiatric:        Behavior: Behavior is cooperative.      Diagnostic studies: none           Salvatore Marvel, MD [facility]

## 2022-07-09 ENCOUNTER — Encounter: Payer: Self-pay | Admitting: Allergy & Immunology

## 2022-07-09 LAB — ALPHA-GAL PANEL
Allergen Lamb IgE: <0.1 kU/L
Beef IgE: <0.1 kU/L
IgE (Immunoglobulin E), Serum: 56 IU/mL (ref 6–495)
O215-IgE Alpha-Gal: <0.1 kU/L
Pork IgE: <0.1 kU/L

## 2022-07-10 DIAGNOSIS — R7303 Prediabetes: Secondary | ICD-10-CM | POA: Diagnosis not present

## 2022-07-10 DIAGNOSIS — G479 Sleep disorder, unspecified: Secondary | ICD-10-CM | POA: Diagnosis not present

## 2022-07-19 LAB — CBC WITH DIFFERENTIAL
Basophils Absolute: 0.1 10*3/uL (ref 0.0–0.2)
Basos: 1 %
EOS (ABSOLUTE): 0.1 10*3/uL (ref 0.0–0.4)
Eos: 2 %
Hematocrit: 43.4 % (ref 34.0–46.6)
Hemoglobin: 14.3 g/dL (ref 11.1–15.9)
Immature Grans (Abs): 0 10*3/uL (ref 0.0–0.1)
Immature Granulocytes: 0 %
Lymphocytes Absolute: 2.4 10*3/uL (ref 0.7–3.1)
Lymphs: 35 %
MCH: 30.4 pg (ref 26.6–33.0)
MCHC: 32.9 g/dL (ref 31.5–35.7)
MCV: 92 fL (ref 79–97)
Monocytes Absolute: 0.4 10*3/uL (ref 0.1–0.9)
Monocytes: 6 %
Neutrophils Absolute: 3.9 10*3/uL (ref 1.4–7.0)
Neutrophils: 56 %
RBC: 4.71 x10E6/uL (ref 3.77–5.28)
RDW: 11.9 % (ref 11.7–15.4)
WBC: 6.9 10*3/uL (ref 3.4–10.8)

## 2022-07-19 LAB — ALLERGEN GUM CARAGEENAN
Carageenan Gum, IgE: <0.35 kU/L (ref <0.35)
Class Interpretation: 0

## 2022-07-19 LAB — XANTHAN GUM IGE
Class Interpretation: 0
Xanthan Gum, IgE*: <0.35 kU/L (ref <0.35)

## 2022-07-19 LAB — C-REACTIVE PROTEIN: CRP: 6 mg/L (ref 0–10)

## 2022-07-19 LAB — ALLERGENS W/COMP RFLX AREA 2
Alternaria Alternata IgE: <0.1 kU/L
Aspergillus Fumigatus IgE: <0.1 kU/L
Bermuda Grass IgE: <0.1 kU/L
Cedar, Mountain IgE: <0.1 kU/L
Cladosporium Herbarum IgE: <0.1 kU/L
Cockroach, German IgE: <0.1 kU/L
Common Silver Birch IgE: <0.1 kU/L
Cottonwood IgE: <0.1 kU/L
D Farinae IgE: <0.1 kU/L
D Pteronyssinus IgE: <0.1 kU/L
E001-IgE Cat Dander: <0.1 kU/L
E005-IgE Dog Dander: <0.1 kU/L
Elm, American IgE: <0.1 kU/L
IgE (Immunoglobulin E), Serum: 55 IU/mL (ref 6–495)
Johnson Grass IgE: <0.1 kU/L
Maple/Box Elder IgE: 0.13 kU/L — AB
Mouse Urine IgE: <0.1 kU/L
Oak, White IgE: <0.1 kU/L
Pecan, Hickory IgE: <0.1 kU/L
Penicillium Chrysogen IgE: <0.1 kU/L
Pigweed, Rough IgE: <0.1 kU/L
Ragweed, Short IgE: <0.1 kU/L
Sheep Sorrel IgE Qn: <0.1 kU/L
Timothy Grass IgE: <0.1 kU/L
White Mulberry IgE: <0.1 kU/L

## 2022-07-19 LAB — THYROID ANTIBODIES
Thyroglobulin Antibody: <1 IU/mL (ref 0.0–0.9)
Thyroperoxidase Ab SerPl-aCnc: 10 IU/mL (ref 0–34)

## 2022-07-19 LAB — CMP14+EGFR
ALT: 23 IU/L (ref 0–32)
AST: 15 IU/L (ref 0–40)
Albumin/Globulin Ratio: 1.9 (ref 1.2–2.2)
Albumin: 4.9 g/dL (ref 3.9–4.9)
Alkaline Phosphatase: 128 IU/L — ABNORMAL HIGH (ref 44–121)
BUN/Creatinine Ratio: 18 (ref 12–28)
BUN: 11 mg/dL (ref 8–27)
Bilirubin Total: 0.3 mg/dL (ref 0.0–1.2)
CO2: 23 mmol/L (ref 20–29)
Calcium: 10.1 mg/dL (ref 8.7–10.3)
Chloride: 98 mmol/L (ref 96–106)
Creatinine, Ser: 0.61 mg/dL (ref 0.57–1.00)
Globulin, Total: 2.6 g/dL (ref 1.5–4.5)
Glucose: 119 mg/dL — ABNORMAL HIGH (ref 70–99)
Potassium: 4.4 mmol/L (ref 3.5–5.2)
Sodium: 139 mmol/L (ref 134–144)
Total Protein: 7.5 g/dL (ref 6.0–8.5)
eGFR: 101 mL/min/{1.73_m2} (ref >59)

## 2022-07-19 LAB — ANTINUCLEAR ANTIBODIES, IFA: ANA Titer 1: NEGATIVE

## 2022-07-19 LAB — SEDIMENTATION RATE: Sed Rate: 26 mm/hr (ref 0–40)

## 2022-07-19 LAB — F297-IGE ACACIA GUM: F297-IgE Acacia Gum: <0.1 kU/L

## 2022-07-19 LAB — ALLERGEN, WHEAT, F4: Wheat IgE: <0.1 kU/L

## 2022-07-19 LAB — TRYPTASE: Tryptase: 7 ug/L (ref 2.2–13.2)

## 2022-07-19 LAB — CHRONIC URTICARIA: cu index: <4.2 (ref <10)

## 2022-07-20 DIAGNOSIS — M1711 Unilateral primary osteoarthritis, right knee: Secondary | ICD-10-CM | POA: Diagnosis not present

## 2022-07-31 DIAGNOSIS — R7303 Prediabetes: Secondary | ICD-10-CM | POA: Diagnosis not present

## 2022-07-31 DIAGNOSIS — G479 Sleep disorder, unspecified: Secondary | ICD-10-CM | POA: Diagnosis not present

## 2022-08-02 DIAGNOSIS — Z4889 Encounter for other specified surgical aftercare: Secondary | ICD-10-CM | POA: Diagnosis not present

## 2022-08-03 DIAGNOSIS — M542 Cervicalgia: Secondary | ICD-10-CM | POA: Diagnosis not present

## 2022-08-16 DIAGNOSIS — M542 Cervicalgia: Secondary | ICD-10-CM | POA: Diagnosis not present

## 2022-08-28 DIAGNOSIS — G479 Sleep disorder, unspecified: Secondary | ICD-10-CM | POA: Diagnosis not present

## 2022-08-28 DIAGNOSIS — J019 Acute sinusitis, unspecified: Secondary | ICD-10-CM | POA: Diagnosis not present

## 2022-08-30 ENCOUNTER — Other Ambulatory Visit: Payer: Medicare Other

## 2022-08-30 DIAGNOSIS — M542 Cervicalgia: Secondary | ICD-10-CM | POA: Diagnosis not present

## 2022-09-06 DIAGNOSIS — M542 Cervicalgia: Secondary | ICD-10-CM | POA: Diagnosis not present

## 2022-09-13 DIAGNOSIS — M542 Cervicalgia: Secondary | ICD-10-CM | POA: Diagnosis not present

## 2022-09-27 DIAGNOSIS — G479 Sleep disorder, unspecified: Secondary | ICD-10-CM | POA: Diagnosis not present

## 2022-10-06 ENCOUNTER — Ambulatory Visit
Admission: RE | Admit: 2022-10-06 | Discharge: 2022-10-06 | Disposition: A | Discharge status: Home / Self Care | Payer: Medicare Other | Source: Ambulatory Visit | Attending: Family Medicine | Admitting: Family Medicine

## 2022-10-06 DIAGNOSIS — N6012 Diffuse cystic mastopathy of left breast: Secondary | ICD-10-CM | POA: Diagnosis not present

## 2022-10-06 DIAGNOSIS — N6320 Unspecified lump in the left breast, unspecified quadrant: Secondary | ICD-10-CM

## 2022-10-18 ENCOUNTER — Encounter: Payer: Self-pay | Admitting: Plastic Surgery

## 2022-10-18 ENCOUNTER — Ambulatory Visit: Payer: Medicare Other | Admitting: Plastic Surgery

## 2022-10-18 VITALS — BP 132/78 | HR 79 | Ht 63.4 in | Wt 143.6 lb

## 2022-10-18 DIAGNOSIS — D170 Benign lipomatous neoplasm of skin and subcutaneous tissue of head, face and neck: Secondary | ICD-10-CM | POA: Diagnosis not present

## 2022-10-18 NOTE — Progress Notes (Signed)
Referring Provider Melina Schools, MD [address] [place],  [place] [address]   CC:  Chief Complaint  Patient presents with   Consult      Makayla Marshall is an 62 y.o. female.  HPI: Makayla Marshall is a very pleasant 62 year old female who presents today for evaluation of a mass on the posterior aspect of her head.  She is not sure how long it has been there but possibly since 2010.  The mass has grown slightly and is very uncomfortable when she lays on her head on the right side she is requesting that it be removed  Allergies  Allergen Reactions   Gluten Meal     redness    Outpatient Encounter Medications as of 10/18/2022  Medication Sig   diphenhydrAMINE (BENADRYL) 25 MG tablet Take 25 mg by mouth at bedtime.   eszopiclone (LUNESTA) 1 MG TABS tablet Take 1 mg by mouth at bedtime.   Melatonin 12 MG TABS Take 12 mg by mouth at bedtime.   phentermine 15 MG capsule Take 1 capsule by mouth daily.   No facility-administered encounter medications on file as of 10/18/2022.     Past Medical History:  Diagnosis Date   ADHD (attention deficit hyperactivity disorder)    Arthritis    Chronic neck pain    Complication of anesthesia    for epidural for childbirth 34 yrs ago, nicked spine and got headache when sat up-got blood patch done   GERD (gastroesophageal reflux disease)    not on medication   PONV (postoperative nausea and vomiting)     Past Surgical History:  Procedure Laterality Date   ANTERIOR CERVICAL DECOMP/DISCECTOMY FUSION N/A 06/22/2022   Procedure: Removal of hardware, anterior cervical discectomy Cervical four to five, Cervical Fusion Cervical four to seven;  Surgeon: Melina Schools, MD;  Location: [facility];  Service: Orthopedics;  Laterality: N/A;  4 hrs 3 C-Bed   CERVICAL SPINE SURGERY     FEMUR FRACTURE SURGERY     HIP ARTHROPLASTY Right 07/2019   meniscus Right 09/2019   torn meniscus repair   TONSILLECTOMY     age 50   TOTAL HIP ARTHROPLASTY  Left 09/07/2014   Procedure: LEFT TOTAL HIP ARTHROPLASTY ANTERIOR APPROACH;  Surgeon: Gearlean Alf, MD;  Location: [facility];  Service: Orthopedics;  Laterality: Left;   TOTAL SHOULDER ARTHROPLASTY Right 07/2020   TUBAL LIGATION      Family History  Problem Relation Age of Onset   Thyroid cancer Mother    Lung cancer Mother        developed from the radation for thyroid cancer/deceased   Breast cancer Maternal Aunt        older aunt-diag at age-around 39, triple negative   Pancreatic cancer Maternal Aunt        younger aunt   Seizures Maternal Uncle    Parkinson's disease Paternal Uncle        and dementia    Social History   Social History Narrative   Not on file     Review of Systems General: Denies fevers, chills, weight loss CV: Denies chest pain, shortness of breath, palpitations Skin: Benign subcutaneous mass on the right side of the occiput  Physical Exam    10/18/2022    8:04 AM 07/06/2022    9:15 AM 06/23/2022    7:47 AM  Vitals with BMI  Height 5' 3.4" 5' 3.5"   Weight 143 lbs 10 oz 164 lbs 3 oz  BMI 27.06 23.76   Systolic 283 151 761  Diastolic 78 66 88  Pulse 79 101 75    General:  No acute distress,  Alert and oriented, Non-Toxic, Normal speech and affect Integument: Soft tissue mass on the posterior aspect of the head on the right side.  The mass is soft mobile does not appear to be fixed to any underlying structures.  It is approximately 5 x 5 cm in size. 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  Because of the location I think that it would be in the patient's best interest if this was done in the operating room.  I discussed this with her and she is agreeable.  She understands that there will be a scar in the scar there may be some hair loss.  The additional risks include bleeding, infection, and recurrence.  Will schedule and proceed at the patient's  request.  Makayla Marshall 10/18/2022, 8:26 AM

## 2022-10-25 DIAGNOSIS — G479 Sleep disorder, unspecified: Secondary | ICD-10-CM | POA: Diagnosis not present

## 2022-10-25 DIAGNOSIS — R632 Polyphagia: Secondary | ICD-10-CM | POA: Diagnosis not present

## 2022-11-13 ENCOUNTER — Encounter: Payer: Self-pay | Admitting: Plastic Surgery

## 2022-11-13 DIAGNOSIS — M7662 Achilles tendinitis, left leg: Secondary | ICD-10-CM | POA: Diagnosis not present

## 2022-11-15 NOTE — Progress Notes (Signed)
Patient ID: Makayla Marshall, female    DOB: 24-Dec-1960, 62 y.o.   MRN: [id number]  Chief Complaint  Patient presents with   Pre-op Exam      ICD-10-CM   1. Lipoma of head  D17.0        History of Present Illness: Makayla Marshall is a 62 y.o.  female  with a history of mass to posterior scalp.  She presents for preoperative evaluation for upcoming procedure, excision of lipoma to the posterior scalp, scheduled for 11/24/2022 with Dr. Lovena Le.  The patient has had problems with anesthesia.  She reports that she has had anesthesia multiple times and typically becomes nauseous afterwards.  She denies any other issues with anesthesia.  Patient denies any cardiac disease.  She denies taking any blood thinners.  Patient reports she is not a smoker.  Patient denies taking any birth control or hormone replacement.  She denies any history of miscarriages.  She denies any personal family history of blood clots or clotting diseases.  She denies any recent surgeries, traumas, infections or hospitalizations.  She denies any history of stroke or heart attack.  She denies any history of inflammatory bowel disease or lung disease.  She denies any history of cancer.  She denies any varicose veins.  She denies any recent fevers, chills or changes in her health.  Summary of Previous Visit: Patient was seen by Dr. Lovena Le on 10/18/2022 for evaluation of a mass to the posterior aspect of her head.  At this visit, patient reported she was not sure how long it had been there, but possibly since 2010.  Patient stated that the mass had grown slightly and it was uncomfortable when she lays down on her head on the right side.  Patient at this visit requested that it be removed.  Plan was to proceed with removal of the lipoma in the operating room.  Job: Retired  [place] Significant for: Lipoma, GERD, arthritis  Patient reports she recently had a musculoskeletal injury which she was placed on meloxicam and a short  6-day course of prednisone.  She states that she is going to finish the prednisone on Sunday.   Past Medical History: Allergies: Allergies  Allergen Reactions   Gluten Meal     redness    Current Medications:  Current Outpatient Medications:    diphenhydrAMINE (BENADRYL) 25 MG tablet, Take 25 mg by mouth at bedtime., Disp: , Rfl:    eszopiclone (LUNESTA) 1 MG TABS tablet, Take 1 mg by mouth at bedtime., Disp: , Rfl:    Melatonin 12 MG TABS, Take 12 mg by mouth at bedtime., Disp: , Rfl:    meloxicam (MOBIC) 7.5 MG tablet, Take 7.5 mg by mouth 2 (two) times daily., Disp: , Rfl:    ondansetron (ZOFRAN) 4 MG tablet, Take 1 tablet (4 mg total) by mouth every 8 (eight) hours as needed for up to 20 doses for nausea or vomiting., Disp: 20 tablet, Rfl: 0   phentermine 15 MG capsule, Take 1 capsule by mouth daily., Disp: , Rfl:    predniSONE (DELTASONE) 10 MG tablet, Take by mouth. As directed., Disp: , Rfl:    traMADol (ULTRAM) 50 MG tablet, Take 1 tablet (50 mg total) by mouth every 8 (eight) hours as needed for up to 5 doses., Disp: 5 tablet, Rfl: 0  Past Medical Problems: Past Medical History:  Diagnosis Date   ADHD (attention deficit hyperactivity disorder)    Arthritis    Chronic  neck pain    Complication of anesthesia    for epidural for childbirth 34 yrs ago, nicked spine and got headache when sat up-got blood patch done   GERD (gastroesophageal reflux disease)    not on medication   PONV (postoperative nausea and vomiting)     Past Surgical History: Past Surgical History:  Procedure Laterality Date   ANTERIOR CERVICAL DECOMP/DISCECTOMY FUSION N/A 06/22/2022   Procedure: Removal of hardware, anterior cervical discectomy Cervical four to five, Cervical Fusion Cervical four to seven;  Surgeon: Melina Schools, MD;  Location: [facility];  Service: Orthopedics;  Laterality: N/A;  4 hrs 3 C-Bed   CERVICAL SPINE SURGERY     FEMUR FRACTURE SURGERY     HIP ARTHROPLASTY Right 07/2019    meniscus Right 09/2019   torn meniscus repair   TONSILLECTOMY     age 70   TOTAL HIP ARTHROPLASTY Left 09/07/2014   Procedure: LEFT TOTAL HIP ARTHROPLASTY ANTERIOR APPROACH;  Surgeon: Gearlean Alf, MD;  Location: [facility];  Service: Orthopedics;  Laterality: Left;   TOTAL SHOULDER ARTHROPLASTY Right 07/2020   TUBAL LIGATION      Social History: Social History   Socioeconomic History   Marital status: Married    Spouse name: Not on file   Number of children: Not on file   Years of education: Not on file   Highest education level: Not on file  Occupational History   Not on file  Tobacco Use   Smoking status: Never   Smokeless tobacco: Never  Vaping Use   Vaping Use: Never used  Substance and Sexual Activity   Alcohol use: Yes    Alcohol/week: 4.0 - 6.0 standard drinks of alcohol    Types: 4 - 6 Glasses of wine per week   Drug use: No   Sexual activity: Yes    Comment: BTL  Other Topics Concern   Not on file  Social History Narrative   Not on file   Social Determinants of Health   Financial Resource Strain: Not on file  Food Insecurity: No Food Insecurity (06/22/2022)   Hunger Vital Sign    Worried About Running Out of Food in the Last Year: Never true    Ran Out of Food in the Last Year: Never true  Transportation Needs: No Transportation Needs (06/22/2022)   PRAPARE - Hydrologist (Medical): No    Lack of Transportation (Non-Medical): No  Physical Activity: Not on file  Stress: Not on file  Social Connections: Not on file  Intimate Partner Violence: Not At Risk (06/22/2022)   Humiliation, Afraid, Rape, and Kick questionnaire    Fear of Current or Ex-Partner: No    Emotionally Abused: No    Physically Abused: No    Sexually Abused: No    Family History: Family History  Problem Relation Age of Onset   Thyroid cancer Mother    Lung cancer Mother        developed from the radation for thyroid cancer/deceased   Breast cancer  Maternal Aunt        older aunt-diag at age-around 43, triple negative   Pancreatic cancer Maternal Aunt        younger aunt   Seizures Maternal Uncle    Parkinson's disease Paternal Uncle        and dementia    Review of Systems: Denies fevers, chills, or recent changes in her health  Physical Exam: Vital Signs BP 115/76 (BP Location: Left  Arm, Patient Position: Sitting, Cuff Size: Normal)   Pulse 77   Ht '5\' 4"'$  (1.626 m)   Wt 137 lb (62.1 kg)   SpO2 99%   BMI 23.52 kg/m   Physical Exam  Constitutional:      General: Not in acute distress.    Appearance: Normal appearance. Not ill-appearing.  HENT:     Head: Small mass noted to the back of the head Neck:     Musculoskeletal: Normal range of motion.  Cardiovascular:     Rate and Rhythm: Normal rate Pulmonary:     Effort: Pulmonary effort is normal. No respiratory distress.  Musculoskeletal: Normal range of motion.  Skin:    General: Skin is warm and dry.  Neurological:     Mental Status: Alert and oriented to person, place, and time. Mental status is at baseline.  Psychiatric:        Mood and Affect: Mood normal.        Behavior: Behavior normal.    Assessment/Plan: The patient is scheduled for 11/24/2022 with Dr. Lovena Le.  Risks, benefits, and alternatives of procedure discussed, questions answered and consent obtained.    Smoking Status: Non-smoker; Counseling Given?  N/A  Caprini Score: 4; Risk Factors include: Age,  and length of planned surgery. Recommendation for mechanical prophylaxis. Encourage early ambulation.   Pictures obtained: Today  Post-op Rx sent to pharmacy:  Tramadol, Zofran  I instructed the patient to hold her phentermine 1 week before surgery.  I discussed with the patient to hold her meloxicam 1 week prior to surgery and hold any vitamins or supplements 1 week prior to surgery.  Patient expressed understanding.  I discussed with the patient to use caution with pain pills with the medication  she takes at bedtime including Benadryl and Lunesta.  Patient expressed understanding.  Patient was provided with the General Surgical Risk consent document and Pain Medication Agreement prior to their appointment.  They had adequate time to read through the risk consent documents and Pain Medication Agreement. We also discussed them in person together during this preop appointment. All of their questions were answered to their satisfaction.  Recommended calling if they have any further questions.  Risk consent form and Pain Medication Agreement to be scanned into patient's chart.  The consent was obtained with risks and complications reviewed which included bleeding, pain, scar, infection and the risk of anesthesia.  The patients questions were answered to the patients expressed satisfaction.   Pictures were obtained of the patient and placed in the chart with the patient's or guardian's permission.   Electronically signed by: Clance Boll, PA-C 11/17/2022 3:41 PM

## 2022-11-15 NOTE — H&P (View-Only) (Signed)
Patient ID: Makayla Marshall, female    DOB: 1961-08-19, 62 y.o.   MRN: [id number]  Chief Complaint  Patient presents with   Pre-op Exam      ICD-10-CM   1. Lipoma of head  D17.0        History of Present Illness: Makayla Marshall is a 62 y.o.  female  with a history of mass to posterior scalp.  She presents for preoperative evaluation for upcoming procedure, excision of lipoma to the posterior scalp, scheduled for 11/24/2022 with Dr. Lovena Le.  The patient has had problems with anesthesia.  She reports that she has had anesthesia multiple times and typically becomes nauseous afterwards.  She denies any other issues with anesthesia.  Patient denies any cardiac disease.  She denies taking any blood thinners.  Patient reports she is not a smoker.  Patient denies taking any birth control or hormone replacement.  She denies any history of miscarriages.  She denies any personal family history of blood clots or clotting diseases.  She denies any recent surgeries, traumas, infections or hospitalizations.  She denies any history of stroke or heart attack.  She denies any history of inflammatory bowel disease or lung disease.  She denies any history of cancer.  She denies any varicose veins.  She denies any recent fevers, chills or changes in her health.  Summary of Previous Visit: Patient was seen by Dr. Lovena Le on 10/18/2022 for evaluation of a mass to the posterior aspect of her head.  At this visit, patient reported she was not sure how long it had been there, but possibly since 2010.  Patient stated that the mass had grown slightly and it was uncomfortable when she lays down on her head on the right side.  Patient at this visit requested that it be removed.  Plan was to proceed with removal of the lipoma in the operating room.  Job: Retired  [place] Significant for: Lipoma, GERD, arthritis  Patient reports she recently had a musculoskeletal injury which she was placed on meloxicam and a short  6-day course of prednisone.  She states that she is going to finish the prednisone on Sunday.   Past Medical History: Allergies: Allergies  Allergen Reactions   Gluten Meal     redness    Current Medications:  Current Outpatient Medications:    diphenhydrAMINE (BENADRYL) 25 MG tablet, Take 25 mg by mouth at bedtime., Disp: , Rfl:    eszopiclone (LUNESTA) 1 MG TABS tablet, Take 1 mg by mouth at bedtime., Disp: , Rfl:    Melatonin 12 MG TABS, Take 12 mg by mouth at bedtime., Disp: , Rfl:    meloxicam (MOBIC) 7.5 MG tablet, Take 7.5 mg by mouth 2 (two) times daily., Disp: , Rfl:    ondansetron (ZOFRAN) 4 MG tablet, Take 1 tablet (4 mg total) by mouth every 8 (eight) hours as needed for up to 20 doses for nausea or vomiting., Disp: 20 tablet, Rfl: 0   phentermine 15 MG capsule, Take 1 capsule by mouth daily., Disp: , Rfl:    predniSONE (DELTASONE) 10 MG tablet, Take by mouth. As directed., Disp: , Rfl:    traMADol (ULTRAM) 50 MG tablet, Take 1 tablet (50 mg total) by mouth every 8 (eight) hours as needed for up to 5 doses., Disp: 5 tablet, Rfl: 0  Past Medical Problems: Past Medical History:  Diagnosis Date   ADHD (attention deficit hyperactivity disorder)    Arthritis    Chronic  neck pain    Complication of anesthesia    for epidural for childbirth 34 yrs ago, nicked spine and got headache when sat up-got blood patch done   GERD (gastroesophageal reflux disease)    not on medication   PONV (postoperative nausea and vomiting)     Past Surgical History: Past Surgical History:  Procedure Laterality Date   ANTERIOR CERVICAL DECOMP/DISCECTOMY FUSION N/A 06/22/2022   Procedure: Removal of hardware, anterior cervical discectomy Cervical four to five, Cervical Fusion Cervical four to seven;  Surgeon: Melina Schools, MD;  Location: [facility];  Service: Orthopedics;  Laterality: N/A;  4 hrs 3 C-Bed   CERVICAL SPINE SURGERY     FEMUR FRACTURE SURGERY     HIP ARTHROPLASTY Right 07/2019    meniscus Right 09/2019   torn meniscus repair   TONSILLECTOMY     age 56   TOTAL HIP ARTHROPLASTY Left 09/07/2014   Procedure: LEFT TOTAL HIP ARTHROPLASTY ANTERIOR APPROACH;  Surgeon: Gearlean Alf, MD;  Location: [facility];  Service: Orthopedics;  Laterality: Left;   TOTAL SHOULDER ARTHROPLASTY Right 07/2020   TUBAL LIGATION      Social History: Social History   Socioeconomic History   Marital status: Married    Spouse name: Not on file   Number of children: Not on file   Years of education: Not on file   Highest education level: Not on file  Occupational History   Not on file  Tobacco Use   Smoking status: Never   Smokeless tobacco: Never  Vaping Use   Vaping Use: Never used  Substance and Sexual Activity   Alcohol use: Yes    Alcohol/week: 4.0 - 6.0 standard drinks of alcohol    Types: 4 - 6 Glasses of wine per week   Drug use: No   Sexual activity: Yes    Comment: BTL  Other Topics Concern   Not on file  Social History Narrative   Not on file   Social Determinants of Health   Financial Resource Strain: Not on file  Food Insecurity: No Food Insecurity (06/22/2022)   Hunger Vital Sign    Worried About Running Out of Food in the Last Year: Never true    Ran Out of Food in the Last Year: Never true  Transportation Needs: No Transportation Needs (06/22/2022)   PRAPARE - Hydrologist (Medical): No    Lack of Transportation (Non-Medical): No  Physical Activity: Not on file  Stress: Not on file  Social Connections: Not on file  Intimate Partner Violence: Not At Risk (06/22/2022)   Humiliation, Afraid, Rape, and Kick questionnaire    Fear of Current or Ex-Partner: No    Emotionally Abused: No    Physically Abused: No    Sexually Abused: No    Family History: Family History  Problem Relation Age of Onset   Thyroid cancer Mother    Lung cancer Mother        developed from the radation for thyroid cancer/deceased   Breast cancer  Maternal Aunt        older aunt-diag at age-around 70, triple negative   Pancreatic cancer Maternal Aunt        younger aunt   Seizures Maternal Uncle    Parkinson's disease Paternal Uncle        and dementia    Review of Systems: Denies fevers, chills, or recent changes in her health  Physical Exam: Vital Signs BP 115/76 (BP Location: Left  Arm, Patient Position: Sitting, Cuff Size: Normal)   Pulse 77   Ht '5\' 4"'$  (1.626 m)   Wt 137 lb (62.1 kg)   SpO2 99%   BMI 23.52 kg/m   Physical Exam  Constitutional:      General: Not in acute distress.    Appearance: Normal appearance. Not ill-appearing.  HENT:     Head: Small mass noted to the back of the head Neck:     Musculoskeletal: Normal range of motion.  Cardiovascular:     Rate and Rhythm: Normal rate Pulmonary:     Effort: Pulmonary effort is normal. No respiratory distress.  Musculoskeletal: Normal range of motion.  Skin:    General: Skin is warm and dry.  Neurological:     Mental Status: Alert and oriented to person, place, and time. Mental status is at baseline.  Psychiatric:        Mood and Affect: Mood normal.        Behavior: Behavior normal.    Assessment/Plan: The patient is scheduled for 11/24/2022 with Dr. Lovena Le.  Risks, benefits, and alternatives of procedure discussed, questions answered and consent obtained.    Smoking Status: Non-smoker; Counseling Given?  N/A  Caprini Score: 4; Risk Factors include: Age,  and length of planned surgery. Recommendation for mechanical prophylaxis. Encourage early ambulation.   Pictures obtained: Today  Post-op Rx sent to pharmacy:  Tramadol, Zofran  I instructed the patient to hold her phentermine 1 week before surgery.  I discussed with the patient to hold her meloxicam 1 week prior to surgery and hold any vitamins or supplements 1 week prior to surgery.  Patient expressed understanding.  I discussed with the patient to use caution with pain pills with the medication  she takes at bedtime including Benadryl and Lunesta.  Patient expressed understanding.  Patient was provided with the General Surgical Risk consent document and Pain Medication Agreement prior to their appointment.  They had adequate time to read through the risk consent documents and Pain Medication Agreement. We also discussed them in person together during this preop appointment. All of their questions were answered to their satisfaction.  Recommended calling if they have any further questions.  Risk consent form and Pain Medication Agreement to be scanned into patient's chart.  The consent was obtained with risks and complications reviewed which included bleeding, pain, scar, infection and the risk of anesthesia.  The patients questions were answered to the patients expressed satisfaction.   Pictures were obtained of the patient and placed in the chart with the patient's or guardian's permission.   Electronically signed by: Clance Boll, PA-C 11/17/2022 3:41 PM

## 2022-11-16 ENCOUNTER — Encounter (HOSPITAL_BASED_OUTPATIENT_CLINIC_OR_DEPARTMENT_OTHER): Payer: Self-pay | Admitting: Plastic Surgery

## 2022-11-17 ENCOUNTER — Encounter: Payer: Self-pay | Admitting: Student

## 2022-11-17 ENCOUNTER — Ambulatory Visit (INDEPENDENT_AMBULATORY_CARE_PROVIDER_SITE_OTHER): Payer: Medicare Other | Admitting: Student

## 2022-11-17 VITALS — BP 115/76 | HR 77 | Ht 64.0 in | Wt 137.0 lb

## 2022-11-17 DIAGNOSIS — D170 Benign lipomatous neoplasm of skin and subcutaneous tissue of head, face and neck: Secondary | ICD-10-CM

## 2022-11-17 MED ORDER — TRAMADOL HCL 50 MG PO TABS: 50.0000 mg | ORAL_TABLET | Freq: Three times a day (TID) | ORAL | 0 refills | Status: AC | PRN

## 2022-11-17 MED ORDER — ONDANSETRON HCL 4 MG PO TABS: 4.0000 mg | ORAL_TABLET | Freq: Three times a day (TID) | ORAL | 0 refills | Status: DC | PRN

## 2022-11-20 DIAGNOSIS — G479 Sleep disorder, unspecified: Secondary | ICD-10-CM | POA: Diagnosis not present

## 2022-11-20 DIAGNOSIS — R632 Polyphagia: Secondary | ICD-10-CM | POA: Diagnosis not present

## 2022-11-24 ENCOUNTER — Ambulatory Visit (HOSPITAL_BASED_OUTPATIENT_CLINIC_OR_DEPARTMENT_OTHER): Payer: Medicare Other | Admitting: Anesthesiology

## 2022-11-24 ENCOUNTER — Other Ambulatory Visit: Payer: Self-pay

## 2022-11-24 ENCOUNTER — Encounter (HOSPITAL_BASED_OUTPATIENT_CLINIC_OR_DEPARTMENT_OTHER): Payer: Self-pay | Admitting: Plastic Surgery

## 2022-11-24 ENCOUNTER — Ambulatory Visit (HOSPITAL_BASED_OUTPATIENT_CLINIC_OR_DEPARTMENT_OTHER)
Admission: RE | Admit: 2022-11-24 | Discharge: 2022-11-24 | Disposition: A | Discharge status: Home / Self Care | Payer: Medicare Other | Source: Ambulatory Visit | Attending: Plastic Surgery | Admitting: Plastic Surgery

## 2022-11-24 ENCOUNTER — Encounter (HOSPITAL_BASED_OUTPATIENT_CLINIC_OR_DEPARTMENT_OTHER)
Admission: RE | Disposition: A | Discharge status: Home / Self Care | Payer: Self-pay | Source: Ambulatory Visit | Attending: Plastic Surgery

## 2022-11-24 DIAGNOSIS — R220 Localized swelling, mass and lump, head: Secondary | ICD-10-CM | POA: Diagnosis not present

## 2022-11-24 DIAGNOSIS — D234 Other benign neoplasm of skin of scalp and neck: Secondary | ICD-10-CM | POA: Insufficient documentation

## 2022-11-24 DIAGNOSIS — Z01818 Encounter for other preprocedural examination: Secondary | ICD-10-CM

## 2022-11-24 HISTORY — PX: LIPOMA EXCISION: SHX5283

## 2022-11-24 SURGERY — EXCISION LIPOMA
Anesthesia: General | Site: Scalp | Laterality: Right

## 2022-11-24 MED ORDER — BACITRACIN 500 UNIT/GM EX OINT
TOPICAL_OINTMENT | CUTANEOUS | Status: DC | PRN
  Administered 2022-11-24: 1 via TOPICAL

## 2022-11-24 MED ORDER — EPHEDRINE 5 MG/ML INJ
INTRAVENOUS | Status: AC
  Filled 2022-11-24: qty 5

## 2022-11-24 MED ORDER — ONDANSETRON HCL 4 MG/2ML IJ SOLN
INTRAMUSCULAR | Status: DC | PRN
  Administered 2022-11-24: 4 mg via INTRAVENOUS

## 2022-11-24 MED ORDER — ROCURONIUM BROMIDE 100 MG/10ML IV SOLN
INTRAVENOUS | Status: DC | PRN
  Administered 2022-11-24: 40 mg via INTRAVENOUS

## 2022-11-24 MED ORDER — ONDANSETRON HCL 4 MG/2ML IJ SOLN
INTRAMUSCULAR | Status: AC
  Filled 2022-11-24: qty 2

## 2022-11-24 MED ORDER — DEXAMETHASONE SODIUM PHOSPHATE 4 MG/ML IJ SOLN
INTRAMUSCULAR | Status: DC | PRN
  Administered 2022-11-24: 5 mg via INTRAVENOUS

## 2022-11-24 MED ORDER — PROPOFOL 10 MG/ML IV BOLUS
INTRAVENOUS | Status: DC | PRN
  Administered 2022-11-24: 100 mg via INTRAVENOUS

## 2022-11-24 MED ORDER — PROPOFOL 10 MG/ML IV BOLUS
INTRAVENOUS | Status: AC
  Filled 2022-11-24: qty 20

## 2022-11-24 MED ORDER — 0.9 % SODIUM CHLORIDE (POUR BTL) OPTIME
TOPICAL | Status: DC | PRN
  Administered 2022-11-24: 75 mL

## 2022-11-24 MED ORDER — MIDAZOLAM HCL 5 MG/5ML IJ SOLN
INTRAMUSCULAR | Status: DC | PRN
  Administered 2022-11-24: 2 mg via INTRAVENOUS

## 2022-11-24 MED ORDER — LIDOCAINE-EPINEPHRINE (PF) 1 %-1:200000 IJ SOLN
INTRAMUSCULAR | Status: DC | PRN
  Administered 2022-11-24: 3 mL

## 2022-11-24 MED ORDER — CHLORHEXIDINE GLUCONATE CLOTH 2 % EX PADS: 6.0000 | MEDICATED_PAD | Freq: Once | CUTANEOUS | Status: DC

## 2022-11-24 MED ORDER — SCOPOLAMINE 1 MG/3DAYS TD PT72
MEDICATED_PATCH | TRANSDERMAL | Status: AC
  Filled 2022-11-24: qty 1

## 2022-11-24 MED ORDER — FENTANYL CITRATE (PF) 100 MCG/2ML IJ SOLN
INTRAMUSCULAR | Status: DC | PRN
  Administered 2022-11-24: 100 ug via INTRAVENOUS

## 2022-11-24 MED ORDER — DEXAMETHASONE SODIUM PHOSPHATE 10 MG/ML IJ SOLN
INTRAMUSCULAR | Status: AC
  Filled 2022-11-24: qty 1

## 2022-11-24 MED ORDER — CEFAZOLIN SODIUM-DEXTROSE 2-4 GM/100ML-% IV SOLN
2.0000 g | INTRAVENOUS | Status: AC
  Administered 2022-11-24: 2 g via INTRAVENOUS

## 2022-11-24 MED ORDER — FENTANYL CITRATE (PF) 100 MCG/2ML IJ SOLN
INTRAMUSCULAR | Status: AC
  Filled 2022-11-24: qty 2

## 2022-11-24 MED ORDER — BACITRACIN ZINC 500 UNIT/GM EX OINT
TOPICAL_OINTMENT | CUTANEOUS | Status: AC
  Filled 2022-11-24: qty 28.35

## 2022-11-24 MED ORDER — SUCCINYLCHOLINE CHLORIDE 200 MG/10ML IV SOSY
PREFILLED_SYRINGE | INTRAVENOUS | Status: AC
  Filled 2022-11-24: qty 10

## 2022-11-24 MED ORDER — LIDOCAINE 2% (20 MG/ML) 5 ML SYRINGE
INTRAMUSCULAR | Status: AC
  Filled 2022-11-24: qty 5

## 2022-11-24 MED ORDER — ACETAMINOPHEN 500 MG PO TABS
1000.0000 mg | ORAL_TABLET | Freq: Once | ORAL | Status: AC
  Administered 2022-11-24: 1000 mg via ORAL

## 2022-11-24 MED ORDER — ACETAMINOPHEN 500 MG PO TABS
ORAL_TABLET | ORAL | Status: AC
  Filled 2022-11-24: qty 2

## 2022-11-24 MED ORDER — MIDAZOLAM HCL 2 MG/2ML IJ SOLN
INTRAMUSCULAR | Status: AC
  Filled 2022-11-24: qty 2

## 2022-11-24 MED ORDER — EPHEDRINE SULFATE (PRESSORS) 50 MG/ML IJ SOLN
INTRAMUSCULAR | Status: DC | PRN
  Administered 2022-11-24: 10 mg via INTRAVENOUS

## 2022-11-24 MED ORDER — SUGAMMADEX SODIUM 200 MG/2ML IV SOLN
INTRAVENOUS | Status: DC | PRN
  Administered 2022-11-24: 200 mg via INTRAVENOUS

## 2022-11-24 MED ORDER — CEFAZOLIN SODIUM-DEXTROSE 2-4 GM/100ML-% IV SOLN
INTRAVENOUS | Status: AC
  Filled 2022-11-24: qty 100

## 2022-11-24 MED ORDER — PHENYLEPHRINE HCL (PRESSORS) 10 MG/ML IV SOLN
INTRAVENOUS | Status: DC | PRN
  Administered 2022-11-24: 80 ug via INTRAVENOUS

## 2022-11-24 MED ORDER — ROCURONIUM BROMIDE 10 MG/ML (PF) SYRINGE
PREFILLED_SYRINGE | INTRAVENOUS | Status: AC
  Filled 2022-11-24: qty 10

## 2022-11-24 MED ORDER — LIDOCAINE HCL (CARDIAC) PF 100 MG/5ML IV SOSY
PREFILLED_SYRINGE | INTRAVENOUS | Status: DC | PRN
  Administered 2022-11-24: 60 mg via INTRAVENOUS

## 2022-11-24 MED ORDER — LACTATED RINGERS IV SOLN: INTRAVENOUS | Status: DC

## 2022-11-24 MED ORDER — PHENYLEPHRINE 80 MCG/ML (10ML) SYRINGE FOR IV PUSH (FOR BLOOD PRESSURE SUPPORT)
PREFILLED_SYRINGE | INTRAVENOUS | Status: AC
  Filled 2022-11-24: qty 10

## 2022-11-24 SURGICAL SUPPLY — 80 items
ADH SKN CLS APL DERMABOND .7 (GAUZE/BANDAGES/DRESSINGS)
APL PRP STRL LF DISP 70% ISPRP (MISCELLANEOUS)
APL SKNCLS STERI-STRIP NONHPOA (GAUZE/BANDAGES/DRESSINGS)
BAND INSRT 18 STRL LF DISP RB (MISCELLANEOUS)
BAND RUBBER #18 3X1/16 STRL (MISCELLANEOUS) IMPLANT
BENZOIN TINCTURE PRP APPL 2/3 (GAUZE/BANDAGES/DRESSINGS) IMPLANT
BLADE CLIPPER SURG (BLADE) IMPLANT
BLADE SURG 15 STRL LF DISP TIS (BLADE) ×1 IMPLANT
BLADE SURG 15 STRL SS (BLADE) ×1
CANISTER SUCT 1200ML W/VALVE (MISCELLANEOUS) IMPLANT
CHLORAPREP W/TINT 26 (MISCELLANEOUS) ×1 IMPLANT
COVER BACK TABLE 60X90IN (DRAPES) ×1 IMPLANT
COVER MAYO STAND STRL (DRAPES) ×1 IMPLANT
DERMABOND ADVANCED .7 DNX12 (GAUZE/BANDAGES/DRESSINGS) IMPLANT
DRAIN JP 10F RND SILICONE (MISCELLANEOUS) IMPLANT
DRAPE LAPAROTOMY 100X72 PEDS (DRAPES) IMPLANT
DRAPE U-SHAPE 76X120 STRL (DRAPES) IMPLANT
DRAPE UTILITY XL STRL (DRAPES) ×1 IMPLANT
DRSG TELFA 3X8 NADH STRL (GAUZE/BANDAGES/DRESSINGS) IMPLANT
ELECT COATED BLADE 2.86 ST (ELECTRODE) IMPLANT
ELECT NDL BLADE 2-5/6 (NEEDLE) ×1 IMPLANT
ELECT NEEDLE BLADE 2-5/6 (NEEDLE) ×1 IMPLANT
ELECT REM PT RETURN 9FT ADLT (ELECTROSURGICAL) ×1
ELECT REM PT RETURN 9FT PED (ELECTROSURGICAL)
ELECTRODE REM PT RETRN 9FT PED (ELECTROSURGICAL) IMPLANT
ELECTRODE REM PT RTRN 9FT ADLT (ELECTROSURGICAL) IMPLANT
EVACUATOR SILICONE 100CC (DRAIN) IMPLANT
GAUZE PAD ABD 8X10 STRL (GAUZE/BANDAGES/DRESSINGS) IMPLANT
GAUZE SPONGE 2X2 STRL 8-PLY (GAUZE/BANDAGES/DRESSINGS) IMPLANT
GAUZE SPONGE 4X4 12PLY STRL LF (GAUZE/BANDAGES/DRESSINGS) IMPLANT
GAUZE XEROFORM 1X8 LF (GAUZE/BANDAGES/DRESSINGS) IMPLANT
GLOVE BIO SURGEON STRL SZ 6.5 (GLOVE) IMPLANT
GLOVE BIO SURGEON STRL SZ7.5 (GLOVE) IMPLANT
GLOVE BIO SURGEON STRL SZ8 (GLOVE) ×1 IMPLANT
GLOVE BIOGEL M STRL SZ7.5 (GLOVE) ×1 IMPLANT
GLOVE BIOGEL PI IND STRL 7.0 (GLOVE) IMPLANT
GLOVE BIOGEL PI IND STRL 7.5 (GLOVE) ×1 IMPLANT
GLOVE SURG SYN 7.5  E (GLOVE) ×1
GLOVE SURG SYN 7.5 E (GLOVE) ×1 IMPLANT
GLOVE SURG SYN 7.5 PF PI (GLOVE) IMPLANT
GOWN STRL REUS W/ TWL LRG LVL3 (GOWN DISPOSABLE) ×2 IMPLANT
GOWN STRL REUS W/ TWL XL LVL3 (GOWN DISPOSABLE) IMPLANT
GOWN STRL REUS W/TWL LRG LVL3 (GOWN DISPOSABLE) ×1
GOWN STRL REUS W/TWL XL LVL3 (GOWN DISPOSABLE) ×1 IMPLANT
NDL HYPO 27GX1-1/4 (NEEDLE) ×1 IMPLANT
NDL HYPO 30GX1 BEV (NEEDLE) IMPLANT
NDL SPNL 18GX3.5 QUINCKE PK (NEEDLE) IMPLANT
NEEDLE HYPO 27GX1-1/4 (NEEDLE) ×1 IMPLANT
NEEDLE HYPO 30GX1 BEV (NEEDLE) IMPLANT
NEEDLE SPNL 18GX3.5 QUINCKE PK (NEEDLE) IMPLANT
NS IRRIG 1000ML POUR BTL (IV SOLUTION) IMPLANT
PACK BASIN DAY SURGERY FS (CUSTOM PROCEDURE TRAY) ×1 IMPLANT
PACK UNIVERSAL I (CUSTOM PROCEDURE TRAY) IMPLANT
PENCIL SMOKE EVACUATOR (MISCELLANEOUS) ×1 IMPLANT
SHEET MEDIUM DRAPE 40X70 STRL (DRAPES) IMPLANT
SLEEVE SCD COMPRESS KNEE MED (STOCKING) IMPLANT
SPONGE T-LAP 18X18 ~~LOC~~+RFID (SPONGE) IMPLANT
STAPLER VISISTAT 35W (STAPLE) ×1 IMPLANT
STOCKINETTE 6  STRL (DRAPES) ×1
STOCKINETTE 6 STRL (DRAPES) IMPLANT
STRIP CLOSURE SKIN 1/2X4 (GAUZE/BANDAGES/DRESSINGS) IMPLANT
SUCTION FRAZIER HANDLE 10FR (MISCELLANEOUS) ×1
SUCTION TUBE FRAZIER 10FR DISP (MISCELLANEOUS) IMPLANT
SUT ETHILON 4 0 PS 2 18 (SUTURE) IMPLANT
SUT MNCRL AB 3-0 PS2 18 (SUTURE) IMPLANT
SUT MNCRL AB 4-0 PS2 18 (SUTURE) IMPLANT
SUT MON AB 5-0 P3 18 (SUTURE) IMPLANT
SUT PDS 3-0 CT2 (SUTURE)
SUT PDS II 3-0 CT2 27 ABS (SUTURE) IMPLANT
SUT PLAIN 5 0 P 3 18 (SUTURE) IMPLANT
SUT PROLENE 5 0 P 3 (SUTURE) IMPLANT
SUT VICRYL 4-0 PS2 18IN ABS (SUTURE) IMPLANT
SWAB COLLECTION DEVICE MRSA (MISCELLANEOUS) IMPLANT
SWAB CULTURE ESWAB REG 1ML (MISCELLANEOUS) IMPLANT
SYR BULB EAR ULCER 3OZ GRN STR (SYRINGE) IMPLANT
SYR CONTROL 10ML LL (SYRINGE) ×1 IMPLANT
TOWEL GREEN STERILE FF (TOWEL DISPOSABLE) ×1 IMPLANT
TRAY DSU PREP LF (CUSTOM PROCEDURE TRAY) IMPLANT
TUBE CONNECTING 20X1/4 (TUBING) IMPLANT
TUBING INFILTRATION IT-10001 (TUBING) IMPLANT

## 2022-11-24 NOTE — Discharge Instructions (Addendum)
  Post Anesthesia Home Care Instructions  Activity: Get plenty of rest for the remainder of the day. A responsible individual must stay with you for 24 hours following the procedure.  For the next 24 hours, DO NOT: -Drive a car -Paediatric nurse -Drink alcoholic beverages -Take any medication unless instructed by your physician -Make any legal decisions or sign important papers.  Meals: Start with liquid foods such as gelatin or soup. Progress to regular foods as tolerated. Avoid greasy, spicy, heavy foods. If nausea and/or vomiting occur, drink only clear liquids until the nausea and/or vomiting subsides. Call your physician if vomiting continues.  Special Instructions/Symptoms: Your throat may feel dry or sore from the anesthesia or the breathing tube placed in your throat during surgery. If this causes discomfort, gargle with warm salt water. The discomfort should disappear within 24 hours.  If you had a scopolamine patch placed behind your ear for the management of post- operative nausea and/or vomiting:  1. The medication in the patch is effective for 72 hours, after which it should be removed.  Wrap patch in a tissue and discard in the trash. Wash hands thoroughly with soap and water. 2. You may remove the patch earlier than 72 hours if you experience unpleasant side effects which may include dry mouth, dizziness or visual disturbances. 3. Avoid touching the patch. Wash your hands with soap and water after contact with the patch.      Next dose ofTylenol may be taken at 7p   Diet: High protein, low sugar.  Medications: Please take the Zofran as needed for nausea symptoms.  As for pain control, please take ibuprofen 400 mg every 6 hours as well as Tylenol 500 mg every 6 hours as needed.  The prescribed tramadol should be taken only as needed for breakthrough pain.  Please note that this drug can make you drowsy.  Wound care: Your incision site was approximated with deep  absorbable sutures and then closed superficially with staples.  Bacitracin was applied over top of the incision.  ABD pad was placed over the incision for drainage/bleeding.  This has been secured with a stockinette hat.  Please replace ABD pad with gauze or maxi pad at home as needed for drainage/bleeding.  Continue to secure dressing with stockinette or beanie hat / cap.  Mild incisional bleeding is normal and not cause for alarm.  Activity: As tolerated.  Mild drainage from underneath the dressing is OK. However, please call the office should you have severe pain or swelling, surrounding redness or malodor, wound dehiscence (if it completely opens), or if you have any fevers.

## 2022-11-24 NOTE — Transfer of Care (Signed)
Immediate Anesthesia Transfer of Care Note  Patient: Makayla Marshall  Procedure(s) Performed: Excision of lipoma, posterior scalp (Right: Scalp)  Patient Location: PACU  Anesthesia Type:General  Level of Consciousness: awake, alert , oriented, drowsy, and patient cooperative  Airway & Oxygen Therapy: Patient Spontanous Breathing and Patient connected to face mask oxygen  Post-op Assessment: Report given to RN and Post -op Vital signs reviewed and stable  Post vital signs: Reviewed and stable  Last Vitals:  Vitals Value Taken Time  BP 121/64 11/24/22 1406  Temp 36.6 C 11/24/22 1406  Pulse 91 11/24/22 1410  Resp 18 11/24/22 1410  SpO2 98 % 11/24/22 1410  Vitals shown include unvalidated device data.  Last Pain:  Vitals:   11/24/22 1406  TempSrc:   PainSc: 0-No pain      Patients Stated Pain Goal: 4 ([id number])  Complications: No notable events documented.

## 2022-11-24 NOTE — Anesthesia Postprocedure Evaluation (Signed)
Anesthesia Post Note  Patient: Makayla Marshall  Procedure(s) Performed: Excision of lipoma, posterior scalp (Right: Scalp)     Patient location during evaluation: PACU Anesthesia Type: General Level of consciousness: awake Pain management: pain level controlled Vital Signs Assessment: post-procedure vital signs reviewed and stable Respiratory status: spontaneous breathing, nonlabored ventilation and respiratory function stable Cardiovascular status: blood pressure returned to baseline and stable Postop Assessment: no apparent nausea or vomiting Anesthetic complications: no   No notable events documented.  Last Vitals:  Vitals:   11/24/22 1430 11/24/22 1445  BP: 127/71 (!) 123/53  Pulse: 80 80  Resp: 18 18  Temp:  (!) 36.2 C  SpO2: 100% 99%    Last Pain:  Vitals:   11/24/22 1445  TempSrc:   PainSc: 0-No pain                 Habeeb Puertas P Remingtyn Depaola

## 2022-11-24 NOTE — Anesthesia Preprocedure Evaluation (Addendum)
Anesthesia Evaluation  Patient identified by MRN, date of birth, ID band Patient awake    Reviewed: Allergy & Precautions, NPO status , Patient's Chart, lab work & pertinent test results  History of Anesthesia Complications (+) PONV and history of anesthetic complications  Airway Mallampati: II  TM Distance: >3 FB Neck ROM: Limited    Dental no notable dental hx.    Pulmonary neg pulmonary ROS   Pulmonary exam normal        Cardiovascular negative cardio ROS Normal cardiovascular exam     Neuro/Psych  PSYCHIATRIC DISORDERS       Neuromuscular disease    GI/Hepatic negative GI ROS, Neg liver ROS,,,  Endo/Other  negative endocrine ROS    Renal/GU negative Renal ROS     Musculoskeletal  (+) Arthritis ,    Abdominal   Peds  (+) ADHD Hematology negative hematology ROS (+)   Anesthesia Other Findings lipoma of head  Reproductive/Obstetrics                             Anesthesia Physical Anesthesia Plan  ASA: 2  Anesthesia Plan: General   Post-op Pain Management:    Induction: Intravenous  PONV Risk Score and Plan: 4 or greater and Ondansetron, Dexamethasone, Midazolam, Scopolamine patch - Pre-op and Treatment may vary due to age or medical condition  Airway Management Planned: Oral ETT  Additional Equipment:   Intra-op Plan:   Post-operative Plan: Extubation in OR  Informed Consent: I have reviewed the patients History and Physical, chart, labs and discussed the procedure including the risks, benefits and alternatives for the proposed anesthesia with the patient or authorized representative who has indicated his/her understanding and acceptance.     Dental advisory given  Plan Discussed with: CRNA  Anesthesia Plan Comments:        Anesthesia Quick Evaluation

## 2022-11-24 NOTE — Interval H&P Note (Signed)
History and Physical Interval Note:  Pt met in pre op holding area, no change in physical exam or indication for surgery Surgical site marked with her concurrence. All questions answered to her satisfaction. Will proceed with excision of the scalp mass at her request.  11/24/2022 12:35 PM  Makayla Marshall  has presented today for surgery, with the diagnosis of lipoma of head.  The various methods of treatment have been discussed with the patient and family. After consideration of risks, benefits and other options for treatment, the patient has consented to  Procedure(s): excision of lipoma, posterior scalp (N/A) as a surgical intervention.  The patient's history has been reviewed, patient examined, no change in status, stable for surgery.  I have reviewed the patient's chart and labs.  Questions were answered to the patient's satisfaction.     Camillia Herter

## 2022-11-24 NOTE — Anesthesia Procedure Notes (Signed)
Procedure Name: Intubation Date/Time: 11/24/2022 1:36 AM  Performed by: Willa Frater, CRNAPre-anesthesia Checklist: Patient identified, Emergency Drugs available, Suction available and Patient being monitored Patient Re-evaluated:Patient Re-evaluated prior to induction Oxygen Delivery Method: Circle system utilized Preoxygenation: Pre-oxygenation with 100% oxygen Induction Type: IV induction Ventilation: Mask ventilation without difficulty Laryngoscope Size: Mac and 3 Grade View: Grade II Tube type: Oral Tube size: 7.0 mm Number of attempts: 1 Airway Equipment and Method: Stylet and Oral airway Placement Confirmation: ETT inserted through vocal cords under direct vision, positive ETCO2 and breath sounds checked- equal and bilateral Secured at: 21 cm Tube secured with: Tape Dental Injury: Teeth and Oropharynx as per pre-operative assessment

## 2022-11-24 NOTE — Op Note (Signed)
DATE OF OPERATION: 11/24/2022  LOCATION: [facility] operating Room  PREOPERATIVE DIAGNOSIS: Posterior scalp mass  POSTOPERATIVE DIAGNOSIS: Same  PROCEDURE: Excision of mass  SURGEON: Jeanann Lewandowsky MD  ASSISTANT: Krista Blue, Donnamarie Rossetti  EBL: 2 cc  CONDITION: Stable  COMPLICATIONS: None  INDICATION: The patient, Makayla Marshall, is a 62 y.o. female born on 1961-03-06, is here for treatment mass on the back of the head.   PROCEDURE DETAILS:  The patient was seen prior to surgery and marked.  IV antibiotics were given. The patient was taken to the operating room and given a general anesthetic. A standard time out was performed and all information was confirmed by those in the room. SCDs were placed.   The mass and surrounding scalp were prepped and draped in usual sterile manner.  The skin overlying the mass was infiltrated with local anesthetic.  A linear incision was made over the mass and a combination of sharp and blunt dissection were used to isolate the mass.  The mass was a fluid-filled cyst and the entire cyst wall was removed without difficulty.  Hemostasis was achieved with the electrocautery the wound was closed with 3-0 Monocryl in the dermis and skin was in the scalp.  There were no complications all instrument needle and sponge counts reported as correct.  The total length of the incision was 2 cm. The patient was allowed to wake up and taken to recovery room in stable condition at the end of the case. The family was notified at the end of the case.   The advanced practice practitioner (APP) assisted throughout the case.  The APP was essential in retraction and counter traction when needed to make the case progress smoothly.  This retraction and assistance made it possible to see the tissue plans for the procedure.  The assistance was needed for blood control, tissue re-approximation and assisted with closure of the incision site.

## 2022-11-27 ENCOUNTER — Encounter (HOSPITAL_BASED_OUTPATIENT_CLINIC_OR_DEPARTMENT_OTHER): Payer: Self-pay | Admitting: Plastic Surgery

## 2022-11-27 ENCOUNTER — Ambulatory Visit: Payer: Medicare Other

## 2022-11-27 LAB — SURGICAL PATHOLOGY

## 2022-12-01 ENCOUNTER — Ambulatory Visit (INDEPENDENT_AMBULATORY_CARE_PROVIDER_SITE_OTHER): Payer: Self-pay | Admitting: Surgical

## 2022-12-01 DIAGNOSIS — Z411 Encounter for cosmetic surgery: Secondary | ICD-10-CM

## 2022-12-01 NOTE — Progress Notes (Unsigned)
Patient is a 62 year old female with history of posterior scalp mass.  Patient underwent excision of the scalp mass with Dr. Lovena Le on 11/24/2022.  Intraoperatively, the mass was noted to be a fluid-filled cyst and the entire cyst wall was removed without difficulty.  The wound was closed with 3-0 Monocryl in the dermis and skin was stapled.  The total length of the incision was 2 cm. Pathology showed pilomatricoma. Patient presents to the clinic today for postprocedural follow-up.  Today, patient reports she is doing well.  She denies any issues or concerns from the surgical site.  She denies any drainage, fevers or chills.  I discussed the results of the pathology with the patient.  On exam, patient is sitting upright in no acute distress.  Incision is intact with staples.  There is no surrounding erythema or drainage.  There is some slight firmness that is consistent with scarring.  There is no fluctuance noted on exam.  There are no signs of infection on noted on exam.  Staples were removed.  Patient tolerated well.  I discussed with the patient that she may apply Vaseline to the area if she would like.  I discussed with her to gently massage the area.  I also discussed with the patient to avoid scrubbing the area for another week or so.  Patient expressed understanding.  I instructed the patient to call back if she has any questions or concerns.  Patient to follow-up as needed.

## 2022-12-01 NOTE — Progress Notes (Signed)
Preoperative Dx: hyperpigmentation, facial vessels  Postoperative Dx:  same  Procedure: laser to face   Anesthesia: none  Description of Procedure:  Risks and complications were explained to the patient. Consent was confirmed and signed. Time out was called and all information was confirmed to be correct. The area  area was prepped with alcohol and wiped dry.    The 560 nm laser was set at 22 J/cm, pulse width 30 ms, temperature 20 C to target the facial vessels.  A total of 34 pulses.  A base pass was then completed with 560 nm filter, fluence 8 J/cm, 10 pulse width, temperature 15 C for total of ~170 passes.  Pigment was then targeted with the 550 nm filter, square adapter, fluence 11 J/cm grade, 10 ms pulse width, 21 degrees temperature  A photo finish pass was then completed at 6 J/cm, pulse width 3 ms, temperature 22 C  There was a total of 450 passes.  Patient tolerated the procedure well there were no complications.  She was explicitly explained in post care instructions including avoiding sun exposure, wearing at least SPF 30-35 daily.

## 2022-12-04 ENCOUNTER — Ambulatory Visit (INDEPENDENT_AMBULATORY_CARE_PROVIDER_SITE_OTHER): Payer: Self-pay | Admitting: Student

## 2022-12-04 ENCOUNTER — Ambulatory Visit (INDEPENDENT_AMBULATORY_CARE_PROVIDER_SITE_OTHER): Payer: Medicare Other | Admitting: Student

## 2022-12-04 ENCOUNTER — Encounter: Payer: Self-pay | Admitting: Student

## 2022-12-04 VITALS — BP 114/64 | HR 76 | Ht 64.0 in | Wt 137.0 lb

## 2022-12-04 DIAGNOSIS — D234 Other benign neoplasm of skin of scalp and neck: Secondary | ICD-10-CM

## 2022-12-04 DIAGNOSIS — Z411 Encounter for cosmetic surgery: Secondary | ICD-10-CM

## 2022-12-04 DIAGNOSIS — R220 Localized swelling, mass and lump, head: Secondary | ICD-10-CM

## 2022-12-04 NOTE — Progress Notes (Cosign Needed Addendum)
Botulinum Toxin Procedure Note  Procedure: Cosmetic botulinum toxin  Pre-operative Diagnosis: Dynamic rhytides  Post-operative Diagnosis: Same  Complications:  None  Brief history: The patient desires botulinum toxin injection. She is interested in addressing her glabellar area today. We did discuss possibly addressing her frontalis, but she decided she just wanted her glabellar area addressed today. She is aware of the risks including bleeding, damage to deeper structures, asymmetry, brow ptosis, eyelid ptosis, bruising. The patient understands and wishes to proceed.  Procedure: The area was prepped with alcohol and dried with a clean gauze.  Using a clean technique the botulinum toxin was diluted with 2.5 mL of bacteriostatic saline per 100 unit vial which resulted in 4 units per 0.1 mL.  Subsequently the mixture was injected in the glabellar area with preservation of the temporal branch to the lateral eyebrow. A total of 12 Units of botulinum toxin was used. The glabellar area was injected with care to inject intramuscular only.   No complications were noted. Light pressure was held for 5 minutes. She was instructed explicitly in post-operative care.  Botox LOT:  [id number] EXP:  2026/03   Pictures were obtained of the patient and placed in the chart with the patient's or guardian's permission.

## 2022-12-13 DIAGNOSIS — M542 Cervicalgia: Secondary | ICD-10-CM | POA: Diagnosis not present

## 2022-12-14 ENCOUNTER — Encounter: Payer: Medicare Other | Admitting: Plastic Surgery

## 2022-12-15 ENCOUNTER — Ambulatory Visit (INDEPENDENT_AMBULATORY_CARE_PROVIDER_SITE_OTHER): Payer: Self-pay | Admitting: Plastic Surgery

## 2022-12-15 ENCOUNTER — Encounter: Payer: Self-pay | Admitting: Plastic Surgery

## 2022-12-15 DIAGNOSIS — Z719 Counseling, unspecified: Secondary | ICD-10-CM

## 2022-12-15 NOTE — Progress Notes (Signed)
The patient had botox 2 weeks ago. The main area was the glabella and mid-forehead.  She noticed accentuation of the lateral forehead wrinkling which she does not like.  We did a total of 8 Units to include the lateral forehead and lateral orbital area.  Follow up when she gets back in town. It will take a week before she notices the difference.  Botox Lot: [id number]

## 2022-12-18 DIAGNOSIS — R632 Polyphagia: Secondary | ICD-10-CM | POA: Diagnosis not present

## 2022-12-18 DIAGNOSIS — G479 Sleep disorder, unspecified: Secondary | ICD-10-CM | POA: Diagnosis not present

## 2022-12-26 DIAGNOSIS — H524 Presbyopia: Secondary | ICD-10-CM | POA: Diagnosis not present

## 2023-01-04 DIAGNOSIS — M1711 Unilateral primary osteoarthritis, right knee: Secondary | ICD-10-CM | POA: Diagnosis not present

## 2023-01-04 DIAGNOSIS — M25561 Pain in right knee: Secondary | ICD-10-CM | POA: Diagnosis not present

## 2023-01-11 DIAGNOSIS — M1711 Unilateral primary osteoarthritis, right knee: Secondary | ICD-10-CM | POA: Diagnosis not present

## 2023-01-18 DIAGNOSIS — M25561 Pain in right knee: Secondary | ICD-10-CM | POA: Diagnosis not present

## 2023-01-18 DIAGNOSIS — M1711 Unilateral primary osteoarthritis, right knee: Secondary | ICD-10-CM | POA: Diagnosis not present

## 2023-01-19 ENCOUNTER — Other Ambulatory Visit: Payer: Medicare Other | Admitting: Surgical

## 2023-01-25 DIAGNOSIS — M7062 Trochanteric bursitis, left hip: Secondary | ICD-10-CM | POA: Diagnosis not present

## 2023-01-25 DIAGNOSIS — M1711 Unilateral primary osteoarthritis, right knee: Secondary | ICD-10-CM | POA: Diagnosis not present

## 2023-02-13 DIAGNOSIS — G479 Sleep disorder, unspecified: Secondary | ICD-10-CM | POA: Diagnosis not present

## 2023-02-13 DIAGNOSIS — R632 Polyphagia: Secondary | ICD-10-CM | POA: Diagnosis not present

## 2023-03-02 DIAGNOSIS — M1711 Unilateral primary osteoarthritis, right knee: Secondary | ICD-10-CM | POA: Diagnosis not present

## 2023-03-08 DIAGNOSIS — M25561 Pain in right knee: Secondary | ICD-10-CM | POA: Diagnosis not present

## 2023-04-10 DIAGNOSIS — R632 Polyphagia: Secondary | ICD-10-CM | POA: Diagnosis not present

## 2023-04-10 DIAGNOSIS — G479 Sleep disorder, unspecified: Secondary | ICD-10-CM | POA: Diagnosis not present

## 2023-04-17 DIAGNOSIS — M23221 Derangement of posterior horn of medial meniscus due to old tear or injury, right knee: Secondary | ICD-10-CM | POA: Diagnosis not present

## 2023-04-17 DIAGNOSIS — M948X6 Other specified disorders of cartilage, lower leg: Secondary | ICD-10-CM | POA: Diagnosis not present

## 2023-04-17 DIAGNOSIS — M23321 Other meniscus derangements, posterior horn of medial meniscus, right knee: Secondary | ICD-10-CM | POA: Diagnosis not present

## 2023-04-17 DIAGNOSIS — G8918 Other acute postprocedural pain: Secondary | ICD-10-CM | POA: Diagnosis not present

## 2023-04-17 DIAGNOSIS — M2241 Chondromalacia patellae, right knee: Secondary | ICD-10-CM | POA: Diagnosis not present

## 2023-04-27 ENCOUNTER — Other Ambulatory Visit: Payer: Self-pay | Admitting: Family Medicine

## 2023-04-27 DIAGNOSIS — N6489 Other specified disorders of breast: Secondary | ICD-10-CM

## 2023-05-14 ENCOUNTER — Other Ambulatory Visit: Payer: Self-pay | Admitting: Family Medicine

## 2023-05-14 ENCOUNTER — Ambulatory Visit
Admission: RE | Admit: 2023-05-14 | Discharge: 2023-05-14 | Disposition: A | Discharge status: Home / Self Care | Payer: Medicare Other | Source: Ambulatory Visit | Attending: Family Medicine | Admitting: Family Medicine

## 2023-05-14 DIAGNOSIS — N6310 Unspecified lump in the right breast, unspecified quadrant: Secondary | ICD-10-CM

## 2023-05-14 DIAGNOSIS — N644 Mastodynia: Secondary | ICD-10-CM

## 2023-05-14 DIAGNOSIS — N6489 Other specified disorders of breast: Secondary | ICD-10-CM

## 2023-05-14 DIAGNOSIS — N6459 Other signs and symptoms in breast: Secondary | ICD-10-CM | POA: Diagnosis not present

## 2023-05-16 DIAGNOSIS — E7800 Pure hypercholesterolemia, unspecified: Secondary | ICD-10-CM | POA: Diagnosis not present

## 2023-05-16 DIAGNOSIS — E559 Vitamin D deficiency, unspecified: Secondary | ICD-10-CM | POA: Diagnosis not present

## 2023-05-16 DIAGNOSIS — R7301 Impaired fasting glucose: Secondary | ICD-10-CM | POA: Diagnosis not present

## 2023-05-21 DIAGNOSIS — Z0000 Encounter for general adult medical examination without abnormal findings: Secondary | ICD-10-CM | POA: Diagnosis not present

## 2023-05-21 DIAGNOSIS — E7800 Pure hypercholesterolemia, unspecified: Secondary | ICD-10-CM | POA: Diagnosis not present

## 2023-05-21 DIAGNOSIS — Z9181 History of falling: Secondary | ICD-10-CM | POA: Diagnosis not present

## 2023-05-24 ENCOUNTER — Other Ambulatory Visit: Payer: Self-pay | Admitting: Oncology

## 2023-05-24 DIAGNOSIS — Z006 Encounter for examination for normal comparison and control in clinical research program: Secondary | ICD-10-CM

## 2023-06-22 DIAGNOSIS — M542 Cervicalgia: Secondary | ICD-10-CM | POA: Diagnosis not present

## 2023-07-02 DIAGNOSIS — M5412 Radiculopathy, cervical region: Secondary | ICD-10-CM | POA: Diagnosis not present

## 2023-07-02 DIAGNOSIS — Z981 Arthrodesis status: Secondary | ICD-10-CM | POA: Diagnosis not present

## 2023-07-11 DIAGNOSIS — R632 Polyphagia: Secondary | ICD-10-CM | POA: Diagnosis not present

## 2023-07-11 DIAGNOSIS — G479 Sleep disorder, unspecified: Secondary | ICD-10-CM | POA: Diagnosis not present

## 2023-10-11 DIAGNOSIS — R632 Polyphagia: Secondary | ICD-10-CM | POA: Diagnosis not present

## 2023-11-13 DIAGNOSIS — Z8639 Personal history of other endocrine, nutritional and metabolic disease: Secondary | ICD-10-CM | POA: Diagnosis not present

## 2023-11-13 DIAGNOSIS — G479 Sleep disorder, unspecified: Secondary | ICD-10-CM | POA: Diagnosis not present

## 2023-11-13 DIAGNOSIS — R632 Polyphagia: Secondary | ICD-10-CM | POA: Diagnosis not present

## 2023-11-20 DIAGNOSIS — J209 Acute bronchitis, unspecified: Secondary | ICD-10-CM | POA: Diagnosis not present

## 2023-11-22 DIAGNOSIS — J209 Acute bronchitis, unspecified: Secondary | ICD-10-CM | POA: Diagnosis not present

## 2023-11-26 DIAGNOSIS — M65341 Trigger finger, right ring finger: Secondary | ICD-10-CM | POA: Diagnosis not present

## 2023-11-26 DIAGNOSIS — S61214A Laceration without foreign body of right ring finger without damage to nail, initial encounter: Secondary | ICD-10-CM | POA: Diagnosis not present

## 2023-12-11 DIAGNOSIS — G479 Sleep disorder, unspecified: Secondary | ICD-10-CM | POA: Diagnosis not present

## 2023-12-11 DIAGNOSIS — Z8639 Personal history of other endocrine, nutritional and metabolic disease: Secondary | ICD-10-CM | POA: Diagnosis not present

## 2023-12-11 DIAGNOSIS — R632 Polyphagia: Secondary | ICD-10-CM | POA: Diagnosis not present

## 2023-12-19 ENCOUNTER — Other Ambulatory Visit: Payer: Self-pay

## 2023-12-19 ENCOUNTER — Ambulatory Visit
Admission: EM | Admit: 2023-12-19 | Discharge: 2023-12-19 | Disposition: A | Discharge status: Home / Self Care | Attending: Family Medicine | Admitting: Family Medicine

## 2023-12-19 DIAGNOSIS — S0101XA Laceration without foreign body of scalp, initial encounter: Secondary | ICD-10-CM

## 2023-12-19 DIAGNOSIS — Z23 Encounter for immunization: Secondary | ICD-10-CM

## 2023-12-19 MED ORDER — TETANUS-DIPHTH-ACELL PERTUSSIS 5-2.5-18.5 LF-MCG/0.5 IM SUSY
0.5000 mL | PREFILLED_SYRINGE | Freq: Once | INTRAMUSCULAR | Status: AC
  Administered 2023-12-19: 0.5 mL via INTRAMUSCULAR

## 2023-12-19 NOTE — ED Provider Notes (Signed)
 [facility]    CSN: [id number] Arrival date & time: 12/19/23  1630      History   Chief Complaint Chief Complaint  Patient presents with   Head Injury    HPI Makayla Marshall is a 63 y.o. female.   The history is provided by the patient.  Head Injury Laceration to scalp, states was retrieving Easter decorations from inside her closet, she slipped and several items fell off the shelf, a metal coffee pod holder hit the back of her scalp.  Denies loss of conscious headache, dizziness, change in vision.  Several hours later her friend noticed there was a cut there.  Admits mild bleeding.  Denies neck pain, paresthesias, nausea, vomiting.  Last tetanus uncertain.  Past Medical History:  Diagnosis Date   ADHD (attention deficit hyperactivity disorder)    Arthritis    Chronic neck pain    Complication of anesthesia    for epidural for childbirth 33 yrs ago, nicked spine and got headache when sat up-got blood patch done   GERD (gastroesophageal reflux disease)    not on medication   PONV (postoperative nausea and vomiting)     Patient Active Problem List   Diagnosis Date Noted   Encounter for counseling 12/15/2022   Cervical radiculopathy 06/22/2022   OA (osteoarthritis) of hip 09/07/2014    Past Surgical History:  Procedure Laterality Date   ANTERIOR CERVICAL DECOMP/DISCECTOMY FUSION N/A 06/22/2022   Procedure: Removal of hardware, anterior cervical discectomy Cervical four to five, Cervical Fusion Cervical four to seven;  Surgeon: Venita Lick, MD;  Location: [facility] OR;  Service: Orthopedics;  Laterality: N/A;  4 hrs 3 C-Bed   CERVICAL SPINE SURGERY     FEMUR FRACTURE SURGERY     HIP ARTHROPLASTY Right 07/2019   LIPOMA EXCISION Right 11/24/2022   Procedure: Excision of lipoma, posterior scalp;  Surgeon: Santiago Glad, MD;  Location: [facility];  Service: Plastics;  Laterality: Right;   meniscus Right 09/2019   torn meniscus repair    TONSILLECTOMY     age 54   TOTAL HIP ARTHROPLASTY Left 09/07/2014   Procedure: LEFT TOTAL HIP ARTHROPLASTY ANTERIOR APPROACH;  Surgeon: Loanne Drilling, MD;  Location: [facility] ORS;  Service: Orthopedics;  Laterality: Left;   TOTAL SHOULDER ARTHROPLASTY Right 07/2020   TUBAL LIGATION      OB History     Gravida  4   Para      Term      Preterm      AB      Living  4      SAB      IAB      Ectopic      Multiple      Live Births  4            Home Medications    Prior to Admission medications   Medication Sig Start Date End Date Taking? Authorizing Provider  diphenhydrAMINE (BENADRYL) 25 MG tablet Take 25 mg by mouth at bedtime.    [provider]  eszopiclone (LUNESTA) 1 MG TABS tablet Take 1 mg by mouth at bedtime. 06/20/22   [provider]  Melatonin 12 MG TABS Take 12 mg by mouth at bedtime.    [provider]  meloxicam (MOBIC) 7.5 MG tablet Take 7.5 mg by mouth 2 (two) times daily. Patient not taking: Reported on 12/04/2022 11/13/22   [provider]  phentermine 15 MG capsule Take 1 capsule  by mouth daily.    [provider]  predniSONE (DELTASONE) 10 MG tablet Take by mouth. As directed. Patient not taking: Reported on 12/04/2022 11/13/22   [provider]  traMADol (ULTRAM) 50 MG tablet Take 1 tablet (50 mg total) by mouth every 8 (eight) hours as needed for up to 5 doses. Patient not taking: Reported on 12/04/2022 11/17/22   Laurena Spies, PA-C    Family History Family History  Problem Relation Age of Onset   Thyroid cancer Mother    Lung cancer Mother        developed from the radation for thyroid cancer/deceased   Breast cancer Maternal Aunt        older aunt-diag at age-around 47, triple negative   Pancreatic cancer Maternal Aunt        younger aunt   Seizures Maternal Uncle    Parkinson's disease Paternal Uncle        and dementia    Social History Social History   Tobacco Use    Smoking status: Never   Smokeless tobacco: Never  Vaping Use   Vaping status: Never Used  Substance Use Topics   Alcohol use: Yes    Alcohol/week: 4.0 - 6.0 standard drinks of alcohol    Types: 4 - 6 Glasses of wine per week   Drug use: No     Allergies   Gluten meal   Review of Systems Review of Systems   Physical Exam Triage Vital Signs ED Triage Vitals  Encounter Vitals Group     BP 12/19/23 1638 (!) 144/86     Systolic BP Percentile --      Diastolic BP Percentile --      Pulse Rate 12/19/23 1638 86     Resp 12/19/23 1638 16     Temp 12/19/23 1638 97.8 F (36.6 C)     Temp Source 12/19/23 1638 Oral     SpO2 12/19/23 1638 96 %     Weight 12/19/23 1638 135 lb (61.2 kg)     Height 12/19/23 1638 5\' 4"  (1.626 m)     Head Circumference --      Peak Flow --      Pain Score 12/19/23 1646 7     Pain Loc --      Pain Education --      Exclude from Growth Chart --    No data found.  Updated Vital Signs BP (!) 144/86 (BP Location: Right Arm)   Pulse 86   Temp 97.8 F (36.6 C) (Oral)   Resp 16   Ht 5\' 4"  (1.626 m)   Wt 135 lb (61.2 kg)   SpO2 96%   BMI 23.17 kg/m   Visual Acuity Right Eye Distance:   Left Eye Distance:   Bilateral Distance:    Right Eye Near:   Left Eye Near:    Bilateral Near:     Physical Exam Vitals and nursing note reviewed.  Constitutional:      Appearance: She is not ill-appearing.  HENT:     Head: Normocephalic.     Comments: 1.5 cm laceration posterior scalp, well-approximated, no active bleeding, no signs of infection no local swelling, no bony defect    Mouth/Throat:     Mouth: Mucous membranes are moist.  Eyes:     Conjunctiva/sclera: Conjunctivae normal.     Pupils: Pupils are equal, round, and reactive to light.  Musculoskeletal:     Cervical back: Neck supple.  Skin:  General: Skin is warm.  Neurological:     Mental Status: She is alert and oriented to person, place, and time.  Psychiatric:        Mood and  Affect: Mood normal.      UC Treatments / Results  Labs (all labs ordered are listed, but only abnormal results are displayed) Labs Reviewed - No data to display  EKG   Radiology No results found.  Procedures Laceration Repair  Date/Time: 12/19/2023 5:09 PM  Performed by: Meliton Rattan, PA Authorized by: Meliton Rattan, PA   Consent:    Consent obtained:  Verbal   Consent given by:  Patient Anesthesia:    Anesthesia method:  Local infiltration   Local anesthetic:  Lidocaine 1% WITH epi Laceration details:    Location:  Scalp   Length (cm):  1.5 Exploration:    Limited defect created (wound extended): no   Treatment:    Area cleansed with:  Shur-Clens Skin repair:    Repair method:  Staples   Number of staples:  3  (including critical care time)  Medications Ordered in UC Medications  Tdap (BOOSTRIX) injection 0.5 mL (has no administration in time range)    Initial Impression / Assessment and Plan / UC Course  I have reviewed the triage vital signs and the nursing notes.  Pertinent labs & imaging results that were available during my care of the patient were reviewed by me and considered in my medical decision making (see chart for details).     63 year old female with small laceration to scalp caused by a light object that fell off a shelf.  No concerns for concussion, wound was cleaned and closed with staples, tetanus was updated, recommend staple removal in 7 days Final Clinical Impressions(s) / UC Diagnoses   Final diagnoses:  Laceration of scalp, initial encounter   Discharge Instructions   None    ED Prescriptions   None    PDMP not reviewed this encounter.   Meliton Rattan, [place] 12/19/23 1725

## 2023-12-19 NOTE — Discharge Instructions (Signed)
Stable removal 7 days

## 2023-12-19 NOTE — ED Triage Notes (Addendum)
 Pt presents with complaints of head injury after being hit in the head by a coffee pot at 1230 PM today. Pt reports she was standing on top of an ottoman, fell to the ground, and coffee pot fell on top of her. Pt denies LOC. Pt describes her pain as a throbbing 7/10. Ibuprofen 800 mg taken 1 hr PTA. Pt cleaned the area with hydrogen peroxide, bleeding is controlled.

## 2023-12-26 ENCOUNTER — Ambulatory Visit
Admission: RE | Admit: 2023-12-26 | Discharge: 2023-12-26 | Disposition: A | Discharge status: Home / Self Care | Source: Ambulatory Visit | Attending: Family Medicine | Admitting: Family Medicine

## 2023-12-26 DIAGNOSIS — Z4802 Encounter for removal of sutures: Secondary | ICD-10-CM | POA: Diagnosis not present

## 2023-12-26 NOTE — ED Triage Notes (Signed)
 Pt presents for staple removal for scalp. Denies any complications. Staples place at [facility]

## 2024-01-02 DIAGNOSIS — M1711 Unilateral primary osteoarthritis, right knee: Secondary | ICD-10-CM | POA: Diagnosis not present

## 2024-01-02 DIAGNOSIS — M170 Bilateral primary osteoarthritis of knee: Secondary | ICD-10-CM | POA: Diagnosis not present

## 2024-02-05 DIAGNOSIS — Z8639 Personal history of other endocrine, nutritional and metabolic disease: Secondary | ICD-10-CM | POA: Diagnosis not present

## 2024-02-05 DIAGNOSIS — R632 Polyphagia: Secondary | ICD-10-CM | POA: Diagnosis not present

## 2024-02-05 DIAGNOSIS — G479 Sleep disorder, unspecified: Secondary | ICD-10-CM | POA: Diagnosis not present

## 2024-02-07 DIAGNOSIS — M542 Cervicalgia: Secondary | ICD-10-CM | POA: Diagnosis not present

## 2024-02-21 DIAGNOSIS — M1711 Unilateral primary osteoarthritis, right knee: Secondary | ICD-10-CM | POA: Diagnosis not present

## 2024-02-28 DIAGNOSIS — M1711 Unilateral primary osteoarthritis, right knee: Secondary | ICD-10-CM | POA: Diagnosis not present

## 2024-03-06 DIAGNOSIS — M1711 Unilateral primary osteoarthritis, right knee: Secondary | ICD-10-CM | POA: Diagnosis not present

## 2024-03-24 DIAGNOSIS — E7800 Pure hypercholesterolemia, unspecified: Secondary | ICD-10-CM | POA: Diagnosis not present

## 2024-04-01 ENCOUNTER — Other Ambulatory Visit (HOSPITAL_COMMUNITY)

## 2024-04-02 ENCOUNTER — Other Ambulatory Visit (HOSPITAL_COMMUNITY)
Admission: RE | Admit: 2024-04-02 | Discharge: 2024-04-02 | Disposition: A | Discharge status: Home / Self Care | Payer: Self-pay | Source: Ambulatory Visit | Attending: Oncology | Admitting: Oncology

## 2024-04-02 DIAGNOSIS — Z006 Encounter for examination for normal comparison and control in clinical research program: Secondary | ICD-10-CM | POA: Insufficient documentation

## 2024-04-12 LAB — GENECONNECT MOLECULAR SCREEN: Genetic Analysis Overall Interpretation: NEGATIVE

## 2024-04-24 DIAGNOSIS — E7800 Pure hypercholesterolemia, unspecified: Secondary | ICD-10-CM | POA: Diagnosis not present

## 2024-04-28 DIAGNOSIS — Z6824 Body mass index (BMI) 24.0-24.9, adult: Secondary | ICD-10-CM | POA: Diagnosis not present

## 2024-04-28 DIAGNOSIS — G479 Sleep disorder, unspecified: Secondary | ICD-10-CM | POA: Diagnosis not present

## 2024-04-28 DIAGNOSIS — R632 Polyphagia: Secondary | ICD-10-CM | POA: Diagnosis not present

## 2024-04-28 DIAGNOSIS — Z8639 Personal history of other endocrine, nutritional and metabolic disease: Secondary | ICD-10-CM | POA: Diagnosis not present

## 2024-05-19 DIAGNOSIS — M65341 Trigger finger, right ring finger: Secondary | ICD-10-CM | POA: Diagnosis not present

## 2024-05-19 DIAGNOSIS — S63436A Traumatic rupture of volar plate of right little finger at metacarpophalangeal and interphalangeal joint, initial encounter: Secondary | ICD-10-CM | POA: Diagnosis not present

## 2024-05-19 DIAGNOSIS — M79644 Pain in right finger(s): Secondary | ICD-10-CM | POA: Diagnosis not present

## 2024-05-19 DIAGNOSIS — M79641 Pain in right hand: Secondary | ICD-10-CM | POA: Diagnosis not present

## 2024-05-19 DIAGNOSIS — M65332 Trigger finger, left middle finger: Secondary | ICD-10-CM | POA: Diagnosis not present

## 2024-05-25 DIAGNOSIS — E7800 Pure hypercholesterolemia, unspecified: Secondary | ICD-10-CM | POA: Diagnosis not present

## 2024-05-29 DIAGNOSIS — E559 Vitamin D deficiency, unspecified: Secondary | ICD-10-CM | POA: Diagnosis not present

## 2024-05-29 DIAGNOSIS — E7800 Pure hypercholesterolemia, unspecified: Secondary | ICD-10-CM | POA: Diagnosis not present

## 2024-05-29 DIAGNOSIS — R7301 Impaired fasting glucose: Secondary | ICD-10-CM | POA: Diagnosis not present

## 2024-06-02 DIAGNOSIS — R7303 Prediabetes: Secondary | ICD-10-CM | POA: Diagnosis not present

## 2024-06-02 DIAGNOSIS — E559 Vitamin D deficiency, unspecified: Secondary | ICD-10-CM | POA: Diagnosis not present

## 2024-06-02 DIAGNOSIS — E7800 Pure hypercholesterolemia, unspecified: Secondary | ICD-10-CM | POA: Diagnosis not present

## 2024-06-02 DIAGNOSIS — R7301 Impaired fasting glucose: Secondary | ICD-10-CM | POA: Diagnosis not present

## 2024-06-02 DIAGNOSIS — Z0000 Encounter for general adult medical examination without abnormal findings: Secondary | ICD-10-CM | POA: Diagnosis not present

## 2024-06-24 DIAGNOSIS — E7800 Pure hypercholesterolemia, unspecified: Secondary | ICD-10-CM | POA: Diagnosis not present

## 2024-07-22 DIAGNOSIS — R632 Polyphagia: Secondary | ICD-10-CM | POA: Diagnosis not present

## 2024-07-22 DIAGNOSIS — Z8639 Personal history of other endocrine, nutritional and metabolic disease: Secondary | ICD-10-CM | POA: Diagnosis not present

## 2024-07-22 DIAGNOSIS — Z6824 Body mass index (BMI) 24.0-24.9, adult: Secondary | ICD-10-CM | POA: Diagnosis not present

## 2024-07-22 DIAGNOSIS — M542 Cervicalgia: Secondary | ICD-10-CM | POA: Diagnosis not present

## 2024-07-22 DIAGNOSIS — G479 Sleep disorder, unspecified: Secondary | ICD-10-CM | POA: Diagnosis not present

## 2024-07-31 ENCOUNTER — Other Ambulatory Visit: Payer: Self-pay | Admitting: Family Medicine

## 2024-07-31 DIAGNOSIS — Z1231 Encounter for screening mammogram for malignant neoplasm of breast: Secondary | ICD-10-CM

## 2024-08-27 ENCOUNTER — Ambulatory Visit
Admission: RE | Admit: 2024-08-27 | Discharge: 2024-08-27 | Disposition: A | Source: Ambulatory Visit | Attending: Family Medicine | Admitting: Family Medicine

## 2024-08-27 DIAGNOSIS — Z1231 Encounter for screening mammogram for malignant neoplasm of breast: Secondary | ICD-10-CM

## 2024-09-23 ENCOUNTER — Other Ambulatory Visit: Payer: Self-pay

## 2024-09-23 ENCOUNTER — Encounter (HOSPITAL_BASED_OUTPATIENT_CLINIC_OR_DEPARTMENT_OTHER): Payer: Self-pay

## 2024-09-23 ENCOUNTER — Emergency Department (HOSPITAL_BASED_OUTPATIENT_CLINIC_OR_DEPARTMENT_OTHER): Admission: EM | Admit: 2024-09-23 | Discharge: 2024-09-23 | Disposition: A | Discharge status: Home / Self Care

## 2024-09-23 DIAGNOSIS — H43392 Other vitreous opacities, left eye: Secondary | ICD-10-CM | POA: Diagnosis present

## 2024-09-23 LAB — BASIC METABOLIC PANEL WITH GFR
Anion gap: 10 (ref 5–15)
BUN: 16 mg/dL (ref 8–23)
CO2: 28 mmol/L (ref 22–32)
Calcium: 9.6 mg/dL (ref 8.9–10.3)
Chloride: 101 mmol/L (ref 98–111)
Creatinine, Ser: 0.54 mg/dL (ref 0.44–1.00)
GFR, Estimated: >60 mL/min
Glucose, Bld: 114 mg/dL — ABNORMAL HIGH (ref 70–99)
Potassium: 4 mmol/L (ref 3.5–5.1)
Sodium: 139 mmol/L (ref 135–145)

## 2024-09-23 LAB — CBC WITH DIFFERENTIAL/PLATELET
Abs Immature Granulocytes: 0.02 K/uL (ref 0.00–0.07)
Basophils Absolute: 0 K/uL (ref 0.0–0.1)
Basophils Relative: 1 %
Eosinophils Absolute: 0.1 K/uL (ref 0.0–0.5)
Eosinophils Relative: 2 %
HCT: 37.8 % (ref 36.0–46.0)
Hemoglobin: 12.9 g/dL (ref 12.0–15.0)
Immature Granulocytes: 0 %
Lymphocytes Relative: 35 %
Lymphs Abs: 1.8 K/uL (ref 0.7–4.0)
MCH: 31.2 pg (ref 26.0–34.0)
MCHC: 34.1 g/dL (ref 30.0–36.0)
MCV: 91.3 fL (ref 80.0–100.0)
Monocytes Absolute: 0.5 K/uL (ref 0.1–1.0)
Monocytes Relative: 9 %
Neutro Abs: 2.8 K/uL (ref 1.7–7.7)
Neutrophils Relative %: 53 %
Platelets: 284 K/uL (ref 150–400)
RBC: 4.14 MIL/uL (ref 3.87–5.11)
RDW: 12.1 % (ref 11.5–15.5)
WBC: 5.2 K/uL (ref 4.0–10.5)
nRBC: 0 % (ref 0.0–0.2)

## 2024-09-23 MED ORDER — TETRACAINE HCL 0.5 % OP SOLN
1.0000 [drp] | Freq: Once | OPHTHALMIC | Status: AC
  Administered 2024-09-23: 1 [drp] via OPHTHALMIC
  Filled 2024-09-23: qty 4

## 2024-09-23 MED ORDER — FLUORESCEIN SODIUM 1 MG OP STRP
1.0000 | ORAL_STRIP | Freq: Once | OPHTHALMIC | Status: AC
  Administered 2024-09-23: 1 via OPHTHALMIC
  Filled 2024-09-23: qty 1

## 2024-09-23 NOTE — ED Provider Notes (Signed)
 " [facility] Provider Note   CSN: [id number] Arrival date & time: 09/23/24  9184     Patient presents with: Eye Problem   Makayla Marshall is a 63 y.o. female with PMHx GERD, OA who presents to ED concerned for floaters and shooting stars in left eye since yesterday. 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 Denies vision loss. Endorses a vague feeling around left eye. Denies eye trauma. States that her eye feels dry. Denies headache, dizziness, chest pain, SOB, nausea, vomiting, diarrhea.     Eye Problem      Prior to Admission medications  Medication Sig Start Date End Date Taking? Authorizing Provider  diphenhydrAMINE  (BENADRYL ) 25 MG tablet Take 25 mg by mouth at bedtime.    [provider]  eszopiclone (LUNESTA) 1 MG TABS tablet Take 1 mg by mouth at bedtime. 06/20/22   [provider]  Melatonin 12 MG TABS Take 12 mg by mouth at bedtime.    [provider]  meloxicam (MOBIC) 7.5 MG tablet Take 7.5 mg by mouth 2 (two) times daily. Patient not taking: Reported on 12/04/2022 11/13/22   [provider]  phentermine 15 MG capsule Take 1 capsule by mouth daily.    [provider]  predniSONE (DELTASONE) 10 MG tablet Take by mouth. As directed. Patient not taking: Reported on 12/04/2022 11/13/22   [provider]  traMADol  (ULTRAM ) 50 MG tablet Take 1 tablet (50 mg total) by mouth every 8 (eight) hours as needed for up to 5 doses. Patient not taking: Reported on 12/04/2022 11/17/22   Andris Estefana BRAVO, PA-C    Allergies: Gluten meal    Review of Systems  Eyes:  Positive for visual disturbance.    Updated Vital Signs BP (!) 142/98   Pulse 71   Temp 98.1 F (36.7 C) (Oral)   Resp 16   Ht 5' 4 (1.626 m)   Wt 63.5 kg   SpO2 99%   BMI 24.03 kg/m   Physical Exam Vitals and nursing note reviewed.   Constitutional:      General: She is not in acute distress. HENT:     Head: Normocephalic and atraumatic.     Mouth/Throat:     Mouth: Mucous membranes are moist.  Eyes:     General: No scleral icterus.       Right eye: No discharge.        Left eye: No discharge.     Extraocular Movements: Extraocular movements intact.     Conjunctiva/sclera: Conjunctivae normal.     Pupils: Pupils are equal, round, and reactive to light.     Comments: IOP 15 with 95% confidence interval. Fluorescin staining without uptake. EOM intact without pain.   Cardiovascular:     Rate and Rhythm: Normal rate.     Pulses: Normal pulses.  Pulmonary:     Effort: Pulmonary effort is normal.  Musculoskeletal:     Right lower leg: No edema.     Left lower leg: No edema.  Skin:    General: Skin is warm and dry.     Findings: No rash.  Neurological:     General: No focal deficit present.     Mental Status: She is alert. Mental status is at baseline.  Psychiatric:        Mood and Affect: Mood normal.     (all labs ordered  are listed, but only abnormal results are displayed) Labs Reviewed  BASIC METABOLIC PANEL WITH GFR - Abnormal; Notable for the following components:      Result Value   Glucose, Bld 114 (*)    All other components within normal limits  CBC WITH DIFFERENTIAL/PLATELET    EKG: None  Radiology: No results found.   Ultrasound ED Ocular  Date/Time: 09/23/2024 10:26 AM  Performed by: Hoy Nidia FALCON, PA-C Authorized by: Hoy Nidia FALCON, PA-C   PROCEDURE DETAILS:    Indications: visual change     Assessed:  Left eye   Images: archived     Limitations:  None Comments:     Very small area of separation noted - possible vitreous detachment.    Medications Ordered in the ED  tetracaine  (PONTOCAINE) 0.5 % ophthalmic solution 1 drop (1 drop Both Eyes Given by Other 09/23/24 0927)  fluorescein  ophthalmic strip 1 strip (1 strip Both Eyes Given by Other 09/23/24 9072)                                     Medical Decision Making Amount and/or Complexity of Data Reviewed Labs: ordered.  Risk Prescription drug management.   This patient presents to the ED for concern of vision changes, this involves an extensive number of treatment options, and is a complaint that carries with it a high risk of complications and morbidity.  The differential diagnosis includes blepharitis, viral/bacterial conjunctivitis, corneal abrasion, dry eye syndrome, subconjunctival hemorrhage, acute angle-closure glaucoma, iritis, keratitis, scleritis, vitreous/retinal detachment   Co morbidities that complicate the patient evaluation  GERD, OA    Additional history obtained:  Dr. Katina PCP   Lab Tests:  I Ordered, and personally interpreted labs.   - CBC: No leukocytosis or anemia - BMP: Mild hyperglycemia at 114.    Problem List / ED Course / Critical interventions / Medication management  Patient presents ED concern for floaters and shooting stars in left eye.  Patient is legally blind in this eye.  Symptoms started over the past 24 hours.  Physical exam reassuring.  IOP 15.  Fluorescein  staining without uptake. Bedside US  showing possible mild vitreous/retinal detachment. Consulted with ophthalmologist on-call Dr. Leslee who's secretary requested that we immediately send patient over to their practice for an appointment at 11AM. Patient agreeable with plan.  I have reviewed the patients home medicines and have made adjustments as needed The patient has been appropriately medically screened and/or stabilized in the ED. I have low suspicion for any other emergent medical condition which would require further screening, evaluation or treatment in the ED or require inpatient management. At time of discharge the patient is hemodynamically stable and in no acute distress. I have discussed work-up results and diagnosis with patient and answered all questions. Patient is  agreeable with discharge plan. We discussed strict return precautions for returning to the emergency department and they verbalized understanding.    Social Determinants of Health:  none       Final diagnoses:  Vitreous floaters of left eye    ED Discharge Orders     None          Hoy Nidia FALCON, [place] 09/23/24 1030    Neysa Caron PARAS, [place] 09/23/24 1522  "

## 2024-09-23 NOTE — Discharge Instructions (Addendum)
 As discussed, please immediately go to the ophthalmologist office.  Appointment is scheduled for 11 AM.  Seek emergency care if experiencing any new or worsening symptoms.

## 2024-09-23 NOTE — ED Triage Notes (Signed)
 Pt was seeing shooting starts in L eye starting yesterday afternoon. Pt reports seeing floaters now and eye dryness. Denies pain at this time.
